# Patient Record
Sex: Female | Born: 1995 | Race: White | Hispanic: No | Marital: Single | Smoking: Never smoker
Health system: Southern US, Community
[De-identification: ages and names within clinical notes are randomized; demographics above are authoritative.]

## PROBLEM LIST (undated history)

## (undated) DIAGNOSIS — L732 Hidradenitis suppurativa: Secondary | ICD-10-CM

## (undated) DIAGNOSIS — T7840XA Allergy, unspecified, initial encounter: Secondary | ICD-10-CM

## (undated) DIAGNOSIS — E230 Hypopituitarism: Secondary | ICD-10-CM

## (undated) DIAGNOSIS — F909 Attention-deficit hyperactivity disorder, unspecified type: Secondary | ICD-10-CM

## (undated) DIAGNOSIS — R4189 Other symptoms and signs involving cognitive functions and awareness: Secondary | ICD-10-CM

## (undated) HISTORY — DX: Hypopituitarism: E23.0

## (undated) HISTORY — DX: Allergy, unspecified, initial encounter: T78.40XA

## (undated) HISTORY — DX: Other symptoms and signs involving cognitive functions and awareness: R41.89

## (undated) HISTORY — DX: Attention-deficit hyperactivity disorder, unspecified type: F90.9

## (undated) HISTORY — DX: Hidradenitis suppurativa: L73.2

## (undated) HISTORY — PX: DENTAL SURGERY: SHX609

---
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
## 2014-01-10 ENCOUNTER — Encounter: Payer: Self-pay | Admitting: Internal Medicine

## 2014-01-10 ENCOUNTER — Ambulatory Visit (INDEPENDENT_AMBULATORY_CARE_PROVIDER_SITE_OTHER): Admitting: Internal Medicine

## 2014-01-10 VITALS — BP 106/62 | HR 87 | Temp 98.5°F | Resp 14 | Ht 62.5 in | Wt 193.8 lb

## 2014-01-10 DIAGNOSIS — F900 Attention-deficit hyperactivity disorder, predominantly inattentive type: Secondary | ICD-10-CM

## 2014-01-10 DIAGNOSIS — Z0000 Encounter for general adult medical examination without abnormal findings: Secondary | ICD-10-CM

## 2014-01-10 NOTE — Progress Notes (Signed)
Subjective:    Patient ID: Carly DellShelly D Mccadden, female    DOB: 18-Mar-1996, 18 y.o.   MRN: [id number]  HPI Here for physical Plans to apply for job but hasn't  Still helps out at home  Limited friends Doesn't drive No men in her life  Still finds the adderall helps Has mood issues when not on the med Has trouble getting thoughts out and speaking right when not taking the meds  Current Outpatient Prescriptions on File Prior to Visit  Medication Sig Dispense Refill  . amphetamine-dextroamphetamine (ADDERALL XR) 20 MG 24 hr capsule Take 1 capsule (20 mg total) by mouth every morning.  30 capsule  0  . clindamycin-benzoyl peroxide (BENZACLIN) gel Apply 1 application topically every morning.      . clindamycin-tretinoin (ZIANA) gel Apply 1 application topically at bedtime.      Marland Kitchen. ibuprofen (ADVIL,MOTRIN) 100 MG/5ML suspension Take 400 mg by mouth 3 (three) times daily as needed. For menstrual pain      . Norgestimate-Ethinyl Estradiol Triphasic (TRI-SPRINTEC) 0.18/0.215/0.25 MG-35 MCG tablet Take 1 tablet by mouth daily.       No current facility-administered medications on file prior to visit.    No Known Allergies  Past Medical History  Diagnosis Date  . Allergy   . ADHD (attention deficit hyperactivity disorder)   . Cognitive impairment   . Hypopituitarism     No past surgical history on file.  Family History  Problem Relation Age of Onset  . Multiple sclerosis Mother   . Diabetes Other   . Heart disease Other   . Hypertension Other   . Cancer Other     History   Social History  . Marital Status: Single    Spouse Name: N/A    Number of Children: N/A  . Years of Education: N/A   Occupational History  . Not on file.   Social History Main Topics  . Smoking status: Never Smoker   . Smokeless tobacco: Never Used  . Alcohol Use: No  . Drug Use: No  . Sexual Activity: Not on file   Other Topics Concern  . Not on file   Social History Narrative   Adopted by  maternal grandparents   Helping care for them now   Review of Systems  Constitutional: Negative for fatigue and unexpected weight change.       Still not really exercising-- mom trying to get her to the [employer] Wears seat belt  HENT: Positive for tinnitus. Negative for dental problem and hearing loss.        Regular with dentist   Eyes: Negative for visual disturbance.       No diplopia or unilateral vision loss  Respiratory: Negative for cough, chest tightness and shortness of breath.   Cardiovascular: Negative for chest pain, palpitations and leg swelling.  Gastrointestinal: Negative for nausea, vomiting, abdominal pain, constipation and blood in stool.       No heartburn   Endocrine: Negative for polydipsia and polyuria.  Genitourinary: Negative for dysuria, hematuria and difficulty urinating.       Periods are mostly painless on the OCP Discussed safe sex  Musculoskeletal: Positive for back pain. Negative for arthralgias and joint swelling.       Rare low back pain  Skin: Negative for rash.       Some acne still-not always consistent with the cream  Allergic/Immunologic: Positive for environmental allergies. Negative for immunocompromised state.       Rarely uses zyrtec  Neurological: Positive for headaches. Negative for dizziness, syncope, weakness, light-headedness and numbness.       Rare headaches-- tylenol helps  Psychiatric/Behavioral: Negative for sleep disturbance and dysphoric mood. The patient is not nervous/anxious.        Objective:   Physical Exam  Constitutional: She is oriented to person, place, and time. She appears well-developed and well-nourished. No distress.  HENT:  Head: Normocephalic and atraumatic.  Right Ear: External ear normal.  Left Ear: External ear normal.  Mouth/Throat: Oropharynx is clear and moist. No oropharyngeal exudate.  Eyes: Conjunctivae and EOM are normal. Pupils are equal, round, and reactive to light.  Neck: Normal range of motion.  Neck supple. No thyromegaly present.  Cardiovascular: Normal rate, regular rhythm, normal heart sounds and intact distal pulses.  Exam reveals no gallop.   No murmur heard. Pulmonary/Chest: Effort normal and breath sounds normal. No respiratory distress. She has no wheezes. She has no rales.  Abdominal: Soft. There is no tenderness.  Musculoskeletal: She exhibits no edema and no tenderness.  Lymphadenopathy:    She has no cervical adenopathy.  Neurological: She is alert and oriented to person, place, and time.  Skin:  Mild but cystic acne on face (mostly forehead)  Psychiatric: She has a normal mood and affect. Her behavior is normal.          Assessment & Plan:

## 2014-01-10 NOTE — Assessment & Plan Note (Signed)
Does okay with the medication 

## 2014-01-10 NOTE — Patient Instructions (Signed)
DASH Eating Plan DASH stands for "Dietary Approaches to Stop Hypertension." The DASH eating plan is a healthy eating plan that has been shown to reduce high blood pressure (hypertension). Additional health benefits may include reducing the risk of type 2 diabetes mellitus, heart disease, and stroke. The DASH eating plan may also help with weight loss. WHAT DO I NEED TO KNOW ABOUT THE DASH EATING PLAN? For the DASH eating plan, you will follow these general guidelines:  Choose foods with a percent daily value for sodium of less than 5% (as listed on the food label).  Use salt-free seasonings or herbs instead of table salt or sea salt.  Check with your health care provider or pharmacist before using salt substitutes.  Eat lower-sodium products, often labeled as "lower sodium" or "no salt added."  Eat fresh foods.  Eat more vegetables, fruits, and low-fat dairy products.  Choose whole grains. Look for the word "whole" as the first word in the ingredient list.  Choose fish and skinless chicken or turkey more often than red meat. Limit fish, poultry, and meat to 6 oz (170 g) each day.  Limit sweets, desserts, sugars, and sugary drinks.  Choose heart-healthy fats.  Limit cheese to 1 oz (28 g) per day.  Eat more home-cooked food and less restaurant, buffet, and fast food.  Limit fried foods.  Cook foods using methods other than frying.  Limit canned vegetables. If you do use them, rinse them well to decrease the sodium.  When eating at a restaurant, ask that your food be prepared with less salt, or no salt if possible. WHAT FOODS CAN I EAT? Seek help from a dietitian for individual calorie needs. Grains Whole grain or whole wheat bread. Brown rice. Whole grain or whole wheat pasta. Quinoa, bulgur, and whole grain cereals. Low-sodium cereals. Corn or whole wheat flour tortillas. Whole grain cornbread. Whole grain crackers. Low-sodium crackers. Vegetables Fresh or frozen vegetables  (raw, steamed, roasted, or grilled). Low-sodium or reduced-sodium tomato and vegetable juices. Low-sodium or reduced-sodium tomato sauce and paste. Low-sodium or reduced-sodium canned vegetables.  Fruits All fresh, canned (in natural juice), or frozen fruits. Meat and Other Protein Products Ground beef (85% or leaner), grass-fed beef, or beef trimmed of fat. Skinless chicken or turkey. Ground chicken or turkey. Pork trimmed of fat. All fish and seafood. Eggs. Dried beans, peas, or lentils. Unsalted nuts and seeds. Unsalted canned beans. Dairy Low-fat dairy products, such as skim or 1% milk, 2% or reduced-fat cheeses, low-fat ricotta or cottage cheese, or plain low-fat yogurt. Low-sodium or reduced-sodium cheeses. Fats and Oils Tub margarines without trans fats. Light or reduced-fat mayonnaise and salad dressings (reduced sodium). Avocado. Safflower, olive, or canola oils. Natural peanut or almond butter. Other Unsalted popcorn and pretzels. The items listed above may not be a complete list of recommended foods or beverages. Contact your dietitian for more options. WHAT FOODS ARE NOT RECOMMENDED? Grains White bread. White pasta. White rice. Refined cornbread. Bagels and croissants. Crackers that contain trans fat. Vegetables Creamed or fried vegetables. Vegetables in a cheese sauce. Regular canned vegetables. Regular canned tomato sauce and paste. Regular tomato and vegetable juices. Fruits Dried fruits. Canned fruit in light or heavy syrup. Fruit juice. Meat and Other Protein Products Fatty cuts of meat. Ribs, chicken wings, bacon, sausage, bologna, salami, chitterlings, fatback, hot dogs, bratwurst, and packaged luncheon meats. Salted nuts and seeds. Canned beans with salt. Dairy Whole or 2% milk, cream, half-and-half, and cream cheese. Whole-fat or sweetened yogurt. Full-fat   cheeses or blue cheese. Nondairy creamers and whipped toppings. Processed cheese, cheese spreads, or cheese  curds. Condiments Onion and garlic salt, seasoned salt, table salt, and sea salt. Canned and packaged gravies. Worcestershire sauce. Tartar sauce. Barbecue sauce. Teriyaki sauce. Soy sauce, including reduced sodium. Steak sauce. Fish sauce. Oyster sauce. Cocktail sauce. Horseradish. Ketchup and mustard. Meat flavorings and tenderizers. Bouillon cubes. Hot sauce. Tabasco sauce. Marinades. Taco seasonings. Relishes. Fats and Oils Butter, stick margarine, lard, shortening, ghee, and bacon fat. Coconut, palm kernel, or palm oils. Regular salad dressings. Other Pickles and olives. Salted popcorn and pretzels. The items listed above may not be a complete list of foods and beverages to avoid. Contact your dietitian for more information. WHERE CAN I FIND MORE INFORMATION? National Heart, Lung, and Blood Institute: www.nhlbi.nih.gov/health/health-topics/topics/dash/ Document Released: 03/03/2011 Document Revised: 07/29/2013 Document Reviewed: 01/16/2013 ExitCare Patient Information 2015 [employer]. This information is not intended to replace advice given to you by your health care provider. Make sure you discuss any questions you have with your health care provider. Exercise to Lose Weight Exercise and a healthy diet may help you lose weight. Your doctor may suggest specific exercises. EXERCISE IDEAS AND TIPS  Choose low-cost things you enjoy doing, such as walking, bicycling, or exercising to workout videos.  Take stairs instead of the elevator.  Walk during your lunch break.  Park your car further away from work or school.  Go to a gym or an exercise class.  Start with 5 to 10 minutes of exercise each day. Build up to 30 minutes of exercise 4 to 6 days a week.  Wear shoes with good support and comfortable clothes.  Stretch before and after working out.  Work out until you breathe harder and your heart beats faster.  Drink extra water when you exercise.  Do not do so much that you  hurt yourself, feel dizzy, or get very short of breath. Exercises that burn about 150 calories:  Running 1  miles in 15 minutes.  Playing volleyball for 45 to 60 minutes.  Washing and waxing a car for 45 to 60 minutes.  Playing touch football for 45 minutes.  Walking 1  miles in 35 minutes.  Pushing a stroller 1  miles in 30 minutes.  Playing basketball for 30 minutes.  Raking leaves for 30 minutes.  Bicycling 5 miles in 30 minutes.  Walking 2 miles in 30 minutes.  Dancing for 30 minutes.  Shoveling snow for 15 minutes.  Swimming laps for 20 minutes.  Walking up stairs for 15 minutes.  Bicycling 4 miles in 15 minutes.  Gardening for 30 to 45 minutes.  Jumping rope for 15 minutes.  Washing windows or floors for 45 to 60 minutes. Document Released: 04/16/2010 Document Revised: 06/06/2011 Document Reviewed: 04/16/2010 ExitCare Patient Information 2015 [employer]. This information is not intended to replace advice given to you by your health care provider. Make sure you discuss any questions you have with your health care provider.  

## 2014-01-10 NOTE — Assessment & Plan Note (Signed)
Healthy but needs to work more on fitness Cognitive issues limit her drive, etc Needs to apply for at least part time work--discussed with mom

## 2014-01-10 NOTE — Progress Notes (Signed)
Pre visit review using our clinic review tool, if applicable. No additional management support is needed unless otherwise documented below in the visit note. 

## 2014-02-18 ENCOUNTER — Other Ambulatory Visit: Payer: Self-pay

## 2014-02-18 MED ORDER — AMPHETAMINE-DEXTROAMPHET ER 20 MG PO CP24: 20.0000 mg | ORAL_CAPSULE | ORAL | Status: DC

## 2014-02-18 NOTE — Telephone Encounter (Addendum)
Pt's mother left v/m requesting rx adderall. Call when ready for pick up.

## 2014-02-18 NOTE — Telephone Encounter (Signed)
Mom advised.  Rx left at front desk for pick up.  

## 2014-06-02 ENCOUNTER — Other Ambulatory Visit: Payer: Self-pay

## 2014-06-02 MED ORDER — AMPHETAMINE-DEXTROAMPHET ER 20 MG PO CP24: 20.0000 mg | ORAL_CAPSULE | ORAL | Status: DC

## 2014-06-02 NOTE — Telephone Encounter (Signed)
Pt's mother left v/m requesting rx for Adderall. Call when ready for pick up. Pt last seen 01/10/14 and Adderall last filled 02/18/14.Please advise.

## 2014-06-02 NOTE — Telephone Encounter (Signed)
Carly Mclaughlin (mom) notified prescription is ready to be picked up at the front desk.

## 2014-08-02 ENCOUNTER — Emergency Department
Admission: EM | Admit: 2014-08-02 | Discharge: 2014-08-02 | Disposition: A | Discharge status: Home / Self Care | Attending: Emergency Medicine | Admitting: Emergency Medicine

## 2014-08-02 ENCOUNTER — Emergency Department

## 2014-08-02 ENCOUNTER — Encounter: Payer: Self-pay | Admitting: Emergency Medicine

## 2014-08-02 DIAGNOSIS — M79675 Pain in left toe(s): Secondary | ICD-10-CM | POA: Diagnosis present

## 2014-08-02 DIAGNOSIS — L600 Ingrowing nail: Secondary | ICD-10-CM | POA: Diagnosis not present

## 2014-08-02 DIAGNOSIS — Z793 Long term (current) use of hormonal contraceptives: Secondary | ICD-10-CM | POA: Insufficient documentation

## 2014-08-02 DIAGNOSIS — Z792 Long term (current) use of antibiotics: Secondary | ICD-10-CM | POA: Diagnosis not present

## 2014-08-02 DIAGNOSIS — L03032 Cellulitis of left toe: Secondary | ICD-10-CM | POA: Insufficient documentation

## 2014-08-02 DIAGNOSIS — Z79899 Other long term (current) drug therapy: Secondary | ICD-10-CM | POA: Diagnosis not present

## 2014-08-02 MED ORDER — BACITRACIN-NEOMYCIN-POLYMYXIN 400-5-5000 EX OINT
TOPICAL_OINTMENT | CUTANEOUS | Status: AC
  Administered 2014-08-02: 1 via TOPICAL
  Filled 2014-08-02: qty 1

## 2014-08-02 MED ORDER — SULFAMETHOXAZOLE-TRIMETHOPRIM 800-160 MG PO TABS: 1.0000 | ORAL_TABLET | Freq: Two times a day (BID) | ORAL | Status: DC

## 2014-08-02 MED ORDER — IBUPROFEN 800 MG PO TABS: 800.0000 mg | ORAL_TABLET | Freq: Three times a day (TID) | ORAL | Status: DC | PRN

## 2014-08-02 MED ORDER — BACITRACIN-NEOMYCIN-POLYMYXIN OINTMENT TUBE
TOPICAL_OINTMENT | Freq: Once | CUTANEOUS | Status: AC
  Administered 2014-08-02: 1 via TOPICAL

## 2014-08-02 MED ORDER — LIDOCAINE HCL (PF) 1 % IJ SOLN
INTRAMUSCULAR | Status: AC
  Filled 2014-08-02: qty 5

## 2014-08-02 MED ORDER — OXYCODONE-ACETAMINOPHEN 5-325 MG PO TABS
ORAL_TABLET | ORAL | Status: AC
  Filled 2014-08-02: qty 1

## 2014-08-02 MED ORDER — LIDOCAINE HCL (PF) 1 % IJ SOLN
INTRAMUSCULAR | Status: AC
  Administered 2014-08-02: 13:00:00
  Filled 2014-08-02: qty 5

## 2014-08-02 MED ORDER — TRAMADOL HCL 50 MG PO TABS: 50.0000 mg | ORAL_TABLET | Freq: Four times a day (QID) | ORAL | Status: DC | PRN

## 2014-08-02 MED ORDER — OXYCODONE-ACETAMINOPHEN 5-325 MG PO TABS
1.0000 | ORAL_TABLET | Freq: Once | ORAL | Status: AC
  Administered 2014-08-02: 1 via ORAL

## 2014-08-02 NOTE — ED Notes (Signed)
Toe reddened

## 2014-08-02 NOTE — ED Provider Notes (Signed)
[facility] Emergency Department Provider Note    ____________________________________________  Time seen: 1057 hrs.  I have reviewed the triage vital signs and the nursing notes.   HISTORY  Chief Complaint Toe Pain       HPI Carly Mclaughlin is a 19 y.o. female complaining of an ingrown toenail on both sides left great toe. He just going about 2 weeks withan discharge from the area today. Patient rates the pain as a 5/10. Patient stated control the pain with ibuprofen and Motrin. She states she is unable to wear any nose shoes secondary to her complaint.    Past Medical History  Diagnosis Date  . Allergy   . ADHD (attention deficit hyperactivity disorder)   . Cognitive impairment   . Hypopituitarism     Patient Active Problem List   Diagnosis Date Noted  . Routine general medical examination at a health care facility 01/03/2013  . Hypopituitarism   . 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  Current Outpatient Rx  Name  Route  Sig  Dispense  Refill  . amphetamine-dextroamphetamine (ADDERALL XR) 20 MG 24 hr capsule   Oral   Take 1 capsule (20 mg total) by mouth every morning.   30 capsule   0   . clindamycin-benzoyl peroxide (BENZACLIN) gel   Topical   Apply 1 application topically every morning.         . clindamycin-tretinoin (ZIANA) gel   Topical   Apply 1 application topically at bedtime.         Marland Kitchen. ibuprofen (ADVIL,MOTRIN) 100 MG/5ML suspension   Oral   Take 400 mg by mouth 3 (three) times daily as needed. For menstrual pain         . ibuprofen (ADVIL,MOTRIN) 800 MG tablet   Oral   Take 1 tablet (800 mg total) by mouth every 8 (eight) hours as needed for moderate pain.   15 tablet   0   . Norgestimate-Ethinyl Estradiol Triphasic (TRI-SPRINTEC) 0.18/0.215/0.25 MG-35 MCG tablet   Oral   Take 1 tablet by mouth daily.         Marland Kitchen.  sulfamethoxazole-trimethoprim (BACTRIM DS,SEPTRA DS) 800-160 MG per tablet   Oral   Take 1 tablet by mouth 2 (two) times daily.   20 tablet   0   . traMADol (ULTRAM) 50 MG tablet   Oral   Take 1 tablet (50 mg total) by mouth every 6 (six) hours as needed for moderate pain.   12 tablet   0     Allergies Review of patient's allergies indicates no known allergies.  Family History  Problem Relation Age of Onset  . Multiple sclerosis Mother   . Diabetes Other   . Heart disease Other   . Hypertension Other   . Cancer Other     Social History History  Substance Use Topics  . Smoking status: Never Smoker   . Smokeless tobacco: Never Used  . Alcohol Use: Not on file    Review of Systems  Constitutional: Negative for fever. Patient is anxious Eyes: Negative for visual changes. ENT: Negative for sore throat. Cardiovascular: Negative for chest pain. Respiratory: Negative for shortness of breath. Gastrointestinal: Negative for abdominal pain, vomiting and diarrhea. Genitourinary: Negative for dysuria. Musculoskeletal: Negative for back pain. Skin: Edema and erythema left great toe. Neurological: Negative for headaches, focal weakness or numbness. Psychiatric:ADHD Endocrine:Hypopituitarism Hematological/Lymphatic:Negative Allergic/Immunilogical: Negative  10-point ROS otherwise negative.  ____________________________________________   PHYSICAL EXAM:  VITAL SIGNS: ED Triage Vitals  Enc Vitals Group     BP 08/02/14 0940 116/65 mmHg     Pulse Rate 08/02/14 0940 86     Resp 08/02/14 0940 18     Temp 08/02/14 0940 98.1 F (36.7 C)     Temp Source 08/02/14 0940 Oral     SpO2 08/02/14 0940 98 %     Weight 08/02/14 0940 190 lb (86.183 kg)     Height 08/02/14 0940 5\' 2"  (1.575 m)     Head Cir --      Peak Flow --      Pain Score --      Pain Loc --      Pain Edu? --      Excl. in GC? --      Constitutional: Alert and oriented. Well appearing and in no  distress. Appears anxious Eyes: Conjunctivae are normal. PERRL. Normal extraocular movements. ENT   Head: Normocephalic and atraumatic.   Nose: No congestion/rhinnorhea.   Mouth/Throat: Mucous membranes are moist.   Neck: No stridor. Hematological/Lymphatic/Immunilogical: No cervical lymphadenopathy. Cardiovascular: Normal rate, regular rhythm. Normal and symmetric distal pulses are present in all extremities. No murmurs, rubs, or gallops. Respiratory: Normal respiratory effort without tachypnea nor retractions. Breath sounds are clear and equal bilaterally. No wheezes/rales/rhonchi. Gastrointestinal: Soft and nontender. No distention. No abdominal bruits. There is no CVA tenderness. Genitourinary: Not examined Musculoskeletal: Nontender with normal range of motion in all extremities. No joint effusions.  No lower extremity tenderness nor edema. Neurologic:  Normal speech and language. No gross focal neurologic deficits are appreciated. Speech is normal. No gait instability. Skin: Skin around left toe is erythematous and edematous. Guarding with palpation. Peripheral to express Psychiatric: Mood and affect are normal. Speech and behavior are normal. Patient exhibits appropriate insight and judgment.  ____________________________________________    LABS (pertinent positives/negatives)  None  ____________________________________________   EKG  None  ____________________________________________    RADIOLOGY  None  ____________________________________________   PROCEDURES  Procedure(s) performed: ToeNail excision. Consent from Grandmother Digital block with 1% lidocaine w/o epi Surgicial prep with betadine Bilateral Toe Nail Wedge excision Clean/ Bandage Patient tolerat procedure well Carly Dellonald Marian Meneely PA-C Critical Care performed: No  ____________________________________________   INITIAL IMPRESSION / ASSESSMENT AND PLAN / ED COURSE  Pertinent labs &  imaging results that were available during my care of the patient were reviewed by me and considered in my medical decision making (see chart for details).  Infected toenail left foot  ____________________________________________   FINAL CLINICAL IMPRESSION(S) / ED DIAGNOSES  Final diagnoses:  Ingrowing toenail with infection     Joni ReiningRonald K Chou Busler, PA-C 08/02/14 1227  Jene Everyobert Kinner, MD 08/02/14 [id number]

## 2014-08-02 NOTE — Discharge Instructions (Signed)
Ingrown Toenail An ingrown toenail occurs when the sharp edge of your toenail grows into the skin. Causes of ingrown toenails include toenails clipped too far back or poorly fitting shoes. Activities involving sudden stops (basketball, tennis) causing "toe jamming" may lead to an ingrown nail. HOME CARE INSTRUCTIONS   Soak the whole foot in warm soapy water for 20 minutes, 3 times per day.  You may lift the edge of the nail away from the sore skin by wedging a small piece of cotton under the corner of the nail. Be careful not to dig (traumatize) and cause more injury to the area.  Wear shoes that fit well. While the ingrown nail is causing problems, sandals may be beneficial.  Trim your toenails regularly and carefully. Cut your toenails straight across, not in a curve. This will prevent injury to the skin at the corners of the toenail.  Keep your feet clean and dry.  Crutches may be helpful early in treatment if walking is painful.  Antibiotics, if prescribed, should be taken as directed.  Return for a wound check in 2 days or as directed.  Only take over-the-counter or prescription medicines for pain, discomfort, or fever as directed by your caregiver. SEEK IMMEDIATE MEDICAL CARE IF:   You have a fever.  You have increasing pain, redness, swelling, or heat at the wound site.  Your toe is not better in 7 days. If conservative treatment is not successful, surgical removal of a portion or all of the nail may be necessary. MAKE SURE YOU:   Understand these instructions.  Will watch your condition.  Will get help right away if you are not doing well or get worse. Document Released: 03/11/2000 Document Revised: 06/06/2011 Document Reviewed: 03/05/2008 [facility]Exit[facility] Patient Information 2015 [place], [place]. This information is not intended to replace advice given to you by your health care provider. Make sure you discuss any questions you have with your health care provider.  Ingrown  Toenail An ingrown toenail occurs when the sharp edge of your toenail grows into the skin. Causes of ingrown toenails include toenails clipped too far back or poorly fitting shoes. Activities involving sudden stops (basketball, tennis) causing "toe jamming" may lead to an ingrown nail. HOME CARE INSTRUCTIONS   Soak the whole foot in warm soapy water for 20 minutes, 3 times per day.  You may lift the edge of the nail away from the sore skin by wedging a small piece of cotton under the corner of the nail. Be careful not to dig (traumatize) and cause more injury to the area.  Wear shoes that fit well. While the ingrown nail is causing problems, sandals may be beneficial.  Trim your toenails regularly and carefully. Cut your toenails straight across, not in a curve. This will prevent injury to the skin at the corners of the toenail.  Keep your feet clean and dry.  Crutches may be helpful early in treatment if walking is painful.  Antibiotics, if prescribed, should be taken as directed.  Return for a wound check in 2 days or as directed.  Only take over-the-counter or prescription medicines for pain, discomfort, or fever as directed by your caregiver. SEEK IMMEDIATE MEDICAL CARE IF:   You have a fever.  You have increasing pain, redness, swelling, or heat at the wound site.  Your toe is not better in 7 days. If conservative treatment is not successful, surgical removal of a portion or all of the nail may be necessary. MAKE SURE YOU:  Understand these instructions.  Will watch your condition.  Will get help right away if you are not doing well or get worse. Document Released: 03/11/2000 Document Revised: 06/06/2011 Document Reviewed: 03/05/2008 [facility]Exit[facility] Patient Information 2015 [place]ExitCare, [place]. This information is not intended to replace advice given to you by your health care provider. Make sure you discuss any questions you have with your health care provider.  Ingrown Toenail An  ingrown toenail occurs when the sharp edge of your toenail grows into the skin. Causes of ingrown toenails include toenails clipped too far back or poorly fitting shoes. Activities involving sudden stops (basketball, tennis) causing "toe jamming" may lead to an ingrown nail. HOME CARE INSTRUCTIONS   Soak the whole foot in warm soapy water for 20 minutes, 3 times per day.  You may lift the edge of the nail away from the sore skin by wedging a small piece of cotton under the corner of the nail. Be careful not to dig (traumatize) and cause more injury to the area.  Wear shoes that fit well. While the ingrown nail is causing problems, sandals may be beneficial.  Trim your toenails regularly and carefully. Cut your toenails straight across, not in a curve. This will prevent injury to the skin at the corners of the toenail.  Keep your feet clean and dry.  Crutches may be helpful early in treatment if walking is painful.  Antibiotics, if prescribed, should be taken as directed.  Return for a wound check in 2 days or as directed.  Only take over-the-counter or prescription medicines for pain, discomfort, or fever as directed by your caregiver. SEEK IMMEDIATE MEDICAL CARE IF:   You have a fever.  You have increasing pain, redness, swelling, or heat at the wound site.  Your toe is not better in 7 days. If conservative treatment is not successful, surgical removal of a portion or all of the nail may be necessary. MAKE SURE YOU:   Understand these instructions.  Will watch your condition.  Will get help right away if you are not doing well or get worse. Document Released: 03/11/2000 Document Revised: 06/06/2011 Document Reviewed: 03/05/2008 [facility]Exit[facility] Patient Information 2015 [place]Exit[place], [place]. This information is not intended to replace advice given to you by your health care provider. Make sure you discuss any questions you have with your health care provider.

## 2014-08-19 ENCOUNTER — Emergency Department
Admission: EM | Admit: 2014-08-19 | Discharge: 2014-08-19 | Disposition: A | Discharge status: Home / Self Care | Attending: Student | Admitting: Student

## 2014-08-19 ENCOUNTER — Encounter: Payer: Self-pay | Admitting: Emergency Medicine

## 2014-08-19 DIAGNOSIS — Z79899 Other long term (current) drug therapy: Secondary | ICD-10-CM | POA: Insufficient documentation

## 2014-08-19 DIAGNOSIS — L600 Ingrowing nail: Secondary | ICD-10-CM | POA: Diagnosis not present

## 2014-08-19 DIAGNOSIS — M79674 Pain in right toe(s): Secondary | ICD-10-CM | POA: Diagnosis present

## 2014-08-19 DIAGNOSIS — Z793 Long term (current) use of hormonal contraceptives: Secondary | ICD-10-CM | POA: Insufficient documentation

## 2014-08-19 MED ORDER — SULFAMETHOXAZOLE-TRIMETHOPRIM 800-160 MG PO TABS: 1.0000 | ORAL_TABLET | Freq: Two times a day (BID) | ORAL | Status: DC

## 2014-08-19 NOTE — Discharge Instructions (Signed)
Ingrown Toenail An ingrown toenail occurs when the sharp edge of your toenail grows into the skin. Causes of ingrown toenails include toenails clipped too far back or poorly fitting shoes. Activities involving sudden stops (basketball, tennis) causing "toe jamming" may lead to an ingrown nail. HOME CARE INSTRUCTIONS   Soak the whole foot in warm soapy water for 20 minutes, 3 times per day.  You may lift the edge of the nail away from the sore skin by wedging a small piece of cotton under the corner of the nail. Be careful not to dig (traumatize) and cause more injury to the area.  Wear shoes that fit well. While the ingrown nail is causing problems, sandals may be beneficial.  Trim your toenails regularly and carefully. Cut your toenails straight across, not in a curve. This will prevent injury to the skin at the corners of the toenail.  Keep your feet clean and dry.  Crutches may be helpful early in treatment if walking is painful.  Antibiotics, if prescribed, should be taken as directed.  Return for a wound check in 2 days or as directed.  Only take over-the-counter or prescription medicines for pain, discomfort, or fever as directed by your caregiver. SEEK IMMEDIATE MEDICAL CARE IF:   You have a fever.  You have increasing pain, redness, swelling, or heat at the wound site.  Your toe is not better in 7 days. If conservative treatment is not successful, surgical removal of a portion or all of the nail may be necessary. MAKE SURE YOU:   Understand these instructions.  Will watch your condition.  Will get help right away if you are not doing well or get worse. Document Released: 03/11/2000 Document Revised: 06/06/2011 Document Reviewed: 03/05/2008 [facility]ExitCare Patient Information 2015 [place]ExitCare, [place]LLC. This information is not intended to replace advice given to you by your health care provider. Make sure you discuss any questions you have with your health care provider.  Keep the  toe clean, dry, and covered.  Take the antibiotic as directed.  Use a file to trim nail edges back. See Dr. Orland Jarredroxler for ongoing treatment.

## 2014-08-19 NOTE — ED Notes (Signed)
Pt alert and oriented X4, active, cooperative, pt in NAD. RR even and unlabored, color WNL.  Pt informed to return if any life threatening symptoms occur.   

## 2014-08-19 NOTE — ED Notes (Signed)
Patient ambulatory to triage with steady gait, without difficulty or distress noted; pt reports ?ingrown right great toenail; c/o pain since last week; redness noted to lateral side nail

## 2014-08-20 NOTE — ED Provider Notes (Signed)
[facility] Emergency Department Provider Note ?____________________________________________ ? Time seen: 2110 ? I have reviewed the triage vital signs and the nursing notes. ________ HISTORY ? Chief Complaint Toe Pain  HPI  Carly Mclaughlin is a 19 y.o. female who reports to the ED with complaints of pain and redness to the right great toe for the last week. She was seen here about 3 weeks prior for treatment of an ingrown toenail to the left great toe. She returns today with some local redness and tenderness to the lateral side of the toenail on the right great toe. She denies any fever, chills, sweats, or direct trauma to the toe.    Past Medical History  Diagnosis Date  . Allergy   . ADHD (attention deficit hyperactivity disorder)   . Cognitive impairment   . Hypopituitarism     Patient Active Problem List   Diagnosis Date Noted  . Routine general medical examination at a health care facility 01/03/2013  . Hypopituitarism   . ADHD (attention deficit hyperactivity disorder), inattentive type 11/15/2006  ? History reviewed. No pertinent past surgical history. ? Current Outpatient Rx  Name  Route  Sig  Dispense  Refill  . amphetamine-dextroamphetamine (ADDERALL XR) 20 MG 24 hr capsule   Oral   Take 1 capsule (20 mg total) by mouth every morning.   30 capsule   0   . clindamycin-benzoyl peroxide (BENZACLIN) gel   Topical   Apply 1 application topically every morning.         . clindamycin-tretinoin (ZIANA) gel   Topical   Apply 1 application topically at bedtime.         Marland Kitchen ibuprofen (ADVIL,MOTRIN) 100 MG/5ML suspension   Oral   Take 400 mg by mouth 3 (three) times daily as needed. For menstrual pain         . ibuprofen (ADVIL,MOTRIN) 800 MG tablet   Oral   Take 1 tablet (800 mg total) by mouth every 8 (eight) hours as needed for moderate pain.   15 tablet   0   . Norgestimate-Ethinyl Estradiol Triphasic (TRI-SPRINTEC) 0.18/0.215/0.25  MG-35 MCG tablet   Oral   Take 1 tablet by mouth daily.         Marland Kitchen sulfamethoxazole-trimethoprim (BACTRIM DS,SEPTRA DS) 800-160 MG per tablet   Oral   Take 1 tablet by mouth 2 (two) times daily.   20 tablet   0   . traMADol (ULTRAM) 50 MG tablet   Oral   Take 1 tablet (50 mg total) by mouth every 6 (six) hours as needed for moderate pain.   12 tablet   0   ? Allergies Review of patient's allergies indicates no known allergies. ? Family History  Problem Relation Age of Onset  . Multiple sclerosis Mother   . Diabetes Other   . Heart disease Other   . Hypertension Other   . Cancer Other   ? Social History History  Substance Use Topics  . Smoking status: Never Smoker   . Smokeless tobacco: Never Used  . Alcohol Use: No   Review of Systems  Constitutional: Negative for fever. HEENT: Negative for head trauma, visual changes, sore throat. Cardiovascular: Negative for chest pain. Respiratory: Negative for shortness of breath. Musculoskeletal: Negative for back pain. Positive for great toe pain. Skin: Negative for rash. Positive for swelling to the cuticle on the right great toe Neurological: Negative for headaches, focal weakness or numbness.  10-point ROS otherwise negative. ____________________________________________  PHYSICAL EXAM:  VITAL SIGNS: ED Triage Vitals  Enc Vitals Group     BP 08/19/14 1922 154/83 mmHg     Pulse Rate 08/19/14 1922 98     Resp --      Temp 08/19/14 1922 98.3 F (36.8 C)     Temp Source 08/19/14 1922 Oral     SpO2 08/19/14 1922 99 %     Weight 08/19/14 1922 190 lb (86.183 kg)     Height 08/19/14 1922 5\' 2"  (1.575 m)     Head Cir --      Peak Flow --      Pain Score 08/19/14 1923 7     Pain Loc --      Pain Edu? --      Excl. in GC? --    Constitutional: Alert and oriented. Well appearing and in no distress. HEENT:Normocephalic and atraumatic.  PERRL. Normal extraocular movements.  Mucous membranes are moist. Cardiovascular:  Normal and symmetric distal pulses are present in the right foot.   Respiratory: Normal respiratory effort without tachypnea.  Musculoskeletal: Nontender with normal range of motion in all extremities. Right great toe with local erythema and slight swelling to the lateral aspect of the nail edge. Minimal amount of pus expressed with manipulation Neurologic:  Normal speech and language. CN II-XII grossly intact. No gait instability. Skin:  Skin is warm, dry and intact. No rash noted. Psychiatric: Mood and affect are normal. Patient exhibits appropriate insight and judgment. ____________ PROCEDURES ? Procedure(s) performed: Verbal consent obtained. Skin prep using alcohol.             Lateral aspect of the nail edge is lifted using hemostats             Lateral nail edge is cut using sterile scissors              Small amount of purulent discharge is expressed                         Patient tolerated the minor procedure well  Critical Care performed: None ______________________________________________________ INITIAL IMPRESSION / ASSESSMENT AND PLAN / ED COURSE  Ingrown toenail. Patient toe dressed with sterile disposable bandage. Home care instructions given.  Patient referred to Dr. Orland Jarredroxler for ongoing care of recurrent ingrown toenails.  ____________________________________________ FINAL CLINICAL IMPRESSION(S) / ED DIAGNOSES?  Final diagnoses:  Ingrown right big toenail      Lissa HoardJenise V Bacon Dusten Ellinwood, PA-C 08/20/14 0019  Gayla DossEryka A Gayle, MD 08/20/14 [id number]

## 2014-10-09 ENCOUNTER — Telehealth: Payer: Self-pay | Admitting: Internal Medicine

## 2014-10-09 NOTE — Telephone Encounter (Signed)
Patient Name: Carly SanfilippoSHELLY Mcmonigle  DOB: 1995/09/26    Initial Comment Caller states her daughter has been dizzy. Nausea. Just doesn't feel like herself, doesn't want to eat.   Nurse Assessment  Nurse: Sherilyn CooterHenry, RN, Thurmond ButtsWade Date/Time Lamount Cohen(Eastern Time): 10/09/2014 10:30:57 AM  Confirm and document reason for call. If symptomatic, describe symptoms. ---Caller states that her daughter is asleep at this time. I asked if she would have her daughter to call us once she wakes up. She states that she will. She states she needs to order a refill for herself, Hydrocodone. I transferred her to the main number for the office for further assistance.  Has the patient traveled out of the country within the last 30 days? ---Not Applicable  Does the patient require triage? ---No     Guidelines    Guideline Title Affirmed Question Affirmed Notes       Final Disposition User   Clinical Call Sherilyn CooterHenry, RN, Thurmond ButtsWade

## 2014-10-09 NOTE — Telephone Encounter (Signed)
Patient Name: Carly Mclaughlin DOB: Apr 21, 1995 Initial Comment caller states her dtr is having dizziness, feels lightheaded and has nausea Nurse Assessment Guidelines Guideline Title Affirmed Question Affirmed Notes Final Disposition User FINAL ATTEMPT MADE - no message left Elijah Birkaldwell, RN, Stark BrayLynda Comments When reached, caller states she already spoke to nurse, not sure that nurse called the correct #. Will verify with patient coordinator. Listened to recording, number should be [phone] When nurse attempted to reach caller at corrected #, unable to get through. # is no longer in service.

## 2014-10-10 ENCOUNTER — Encounter: Payer: Self-pay | Admitting: Family Medicine

## 2014-10-10 ENCOUNTER — Ambulatory Visit (INDEPENDENT_AMBULATORY_CARE_PROVIDER_SITE_OTHER): Admitting: Family Medicine

## 2014-10-10 VITALS — BP 110/60 | HR 106 | Temp 98.2°F | Ht 62.0 in | Wt 210.0 lb

## 2014-10-10 DIAGNOSIS — R42 Dizziness and giddiness: Secondary | ICD-10-CM | POA: Diagnosis not present

## 2014-10-10 DIAGNOSIS — R110 Nausea: Secondary | ICD-10-CM | POA: Insufficient documentation

## 2014-10-10 NOTE — Patient Instructions (Addendum)
Push fluids like water.  Start home desensitization exercise.  Stop sulfa medicaiton just in case cause SE given infection improved.  Call if symptoms not improving  In 2 weeks for lab evaluation.

## 2014-10-10 NOTE — Progress Notes (Signed)
Pre visit review using our clinic review tool, if applicable. No additional management support is needed unless otherwise documented below in the visit note. 

## 2014-10-10 NOTE — Assessment & Plan Note (Signed)
Along with decrease appetitte, HA etc.Most likely due to sulfa. NO further infection of toenails. Go ahead and stop.

## 2014-10-10 NOTE — Assessment & Plan Note (Signed)
?   secondary to sulfa vs. Low BP vs BPPV.  Push fluids.  Treat with home Eply maneuver.  Consider lab eval if not improving.

## 2014-10-10 NOTE — Progress Notes (Signed)
   Subjective:    Patient ID: Carly Mclaughlin, female    DOB: [birth year], 19 y.o.   MRN: [id number]  HPI  19 year old female pt of Dr. Karle StarchLetvak's with history of  ADD, hypopituitary presents with  new onset  nausea, dizziness in last week.  Has lightheadedness, room spinning lasts few minutes.  HAs noted occ with going from sitting to standing, getting up from bed. Occuring off and on through through the day.  Occ feels hot an cold. Occ headache. Tried OTC analgesic. BP at home   80/50.Marland Kitchen. drank some water.  No rash.  No current allergy symtpoms no ear pain, no congesiton.  BP Readings from Last 3 Encounters:  10/10/14 110/60  08/19/14 154/83  08/02/14 116/65     Currently treated for ingrown toenail,  Did n't take initially as she was supposed to Mom had her restart on sulfa now for last week.  Makes her have decreased appetitie.  toenails are healed.   Review of Systems  Constitutional: Negative for fever and fatigue.  HENT: Negative for ear pain.   Eyes: Negative for pain.  Respiratory: Negative for chest tightness and shortness of breath.   Cardiovascular: Negative for chest pain, palpitations and leg swelling.  Gastrointestinal: Negative for abdominal pain.  Genitourinary: Negative for dysuria.       Objective:   Physical Exam  Constitutional: Vital signs are normal. She appears well-developed and well-nourished. She is cooperative.  Non-toxic appearance. She does not appear ill. No distress.  Overweight female in NAD  HENT:  Head: Normocephalic.  Right Ear: Hearing, tympanic membrane, external ear and ear canal normal. Tympanic membrane is not erythematous, not retracted and not bulging.  Left Ear: Hearing, tympanic membrane, external ear and ear canal normal. Tympanic membrane is not erythematous, not retracted and not bulging.  Nose: No mucosal edema or rhinorrhea. Right sinus exhibits no maxillary sinus tenderness and no frontal sinus tenderness. Left sinus exhibits no  maxillary sinus tenderness and no frontal sinus tenderness.  Mouth/Throat: Uvula is midline, oropharynx is clear and moist and mucous membranes are normal.  Eyes: Conjunctivae, EOM and lids are normal. Pupils are equal, round, and reactive to light. Lids are everted and swept, no foreign bodies found.  Neck: Trachea normal and normal range of motion. Neck supple. Carotid bruit is not present. No thyroid mass and no thyromegaly present.  Cardiovascular: Normal rate, regular rhythm, S1 normal, S2 normal, normal heart sounds, intact distal pulses and normal pulses.  Exam reveals no gallop and no friction rub.   No murmur heard. nml capillary refill, no peripheral edmea  Pulmonary/Chest: Effort normal and breath sounds normal. No tachypnea. No respiratory distress. She has no decreased breath sounds. She has no wheezes. She has no rhonchi. She has no rales.  Abdominal: Soft. Normal appearance and bowel sounds are normal. There is no tenderness.  Neurological: She is alert.  Negative modified dix hallpike  Skin: Skin is warm, dry and intact. No rash noted.  Psychiatric: Her speech is normal and behavior is normal. Judgment and thought content normal. Her mood appears not anxious. Cognition and memory are normal. She does not exhibit a depressed mood.          Assessment & Plan:

## 2014-10-15 ENCOUNTER — Ambulatory Visit (INDEPENDENT_AMBULATORY_CARE_PROVIDER_SITE_OTHER): Admitting: Internal Medicine

## 2014-10-15 ENCOUNTER — Encounter: Payer: Self-pay | Admitting: Internal Medicine

## 2014-10-15 VITALS — BP 120/80 | HR 111 | Temp 99.3°F | Wt 211.0 lb

## 2014-10-15 DIAGNOSIS — R110 Nausea: Secondary | ICD-10-CM | POA: Diagnosis not present

## 2014-10-15 DIAGNOSIS — R590 Localized enlarged lymph nodes: Secondary | ICD-10-CM | POA: Insufficient documentation

## 2014-10-15 LAB — CBC WITH DIFFERENTIAL/PLATELET
BASOS PCT: 0.3 % (ref 0.0–3.0)
Basophils Absolute: 0 10*3/uL (ref 0.0–0.1)
Eosinophils Absolute: 0 10*3/uL (ref 0.0–0.7)
Eosinophils Relative: 0.3 % (ref 0.0–5.0)
HEMATOCRIT: 36.3 % (ref 36.0–46.0)
Hemoglobin: 12 g/dL (ref 12.0–15.0)
LYMPHS PCT: 51.7 % — AB (ref 12.0–46.0)
Lymphs Abs: 5.3 10*3/uL — ABNORMAL HIGH (ref 0.7–4.0)
MCHC: 33.2 g/dL (ref 30.0–36.0)
MCV: 83.2 fl (ref 78.0–100.0)
MONOS PCT: 8 % (ref 3.0–12.0)
Monocytes Absolute: 0.8 10*3/uL (ref 0.1–1.0)
NEUTROS ABS: 4 10*3/uL (ref 1.4–7.7)
NEUTROS PCT: 39.7 % — AB (ref 43.0–77.0)
Platelets: 248 10*3/uL (ref 150.0–400.0)
RBC: 4.36 Mil/uL (ref 3.87–5.11)
RDW: 14.3 % (ref 11.5–14.6)
WBC: 10.2 10*3/uL (ref 4.5–10.5)

## 2014-10-15 LAB — T4, FREE: Free T4: 1 ng/dL (ref 0.60–1.60)

## 2014-10-15 LAB — COMPREHENSIVE METABOLIC PANEL
ALT: 53 U/L — ABNORMAL HIGH (ref 0–35)
AST: 61 U/L — ABNORMAL HIGH (ref 0–37)
Albumin: 3.7 g/dL (ref 3.5–5.2)
Alkaline Phosphatase: 139 U/L — ABNORMAL HIGH (ref 39–117)
BUN: 7 mg/dL (ref 6–23)
CHLORIDE: 96 meq/L (ref 96–112)
CO2: 30 mEq/L (ref 19–32)
Calcium: 9.6 mg/dL (ref 8.4–10.5)
Creatinine, Ser: 0.67 mg/dL (ref 0.40–1.20)
GFR: 118.84 mL/min (ref >60.00)
GLUCOSE: 81 mg/dL (ref 70–99)
POTASSIUM: 4.2 meq/L (ref 3.5–5.1)
Sodium: 134 mEq/L — ABNORMAL LOW (ref 135–145)
Total Bilirubin: 0.6 mg/dL (ref 0.2–1.2)
Total Protein: 7.4 g/dL (ref 6.0–8.3)

## 2014-10-15 LAB — SEDIMENTATION RATE: Sed Rate: 51 mm/hr — ABNORMAL HIGH (ref 0–22)

## 2014-10-15 MED ORDER — AMPHETAMINE-DEXTROAMPHET ER 20 MG PO CP24: 20.0000 mg | ORAL_CAPSULE | ORAL | Status: DC

## 2014-10-15 NOTE — Patient Instructions (Signed)
DASH Eating Plan °DASH stands for "Dietary Approaches to Stop Hypertension." The DASH eating plan is a healthy eating plan that has been shown to reduce high blood pressure (hypertension). Additional health benefits may include reducing the risk of type 2 diabetes mellitus, heart disease, and stroke. The DASH eating plan may also help with weight loss. °WHAT DO I NEED TO KNOW ABOUT THE DASH EATING PLAN? °For the DASH eating plan, you will follow these general guidelines: °· Choose foods with a percent daily value for sodium of less than 5% (as listed on the food label). °· Use salt-free seasonings or herbs instead of table salt or sea salt. °· Check with your health care provider or pharmacist before using salt substitutes. °· Eat lower-sodium products, often labeled as "lower sodium" or "no salt added." °· Eat fresh foods. °· Eat more vegetables, fruits, and low-fat dairy products. °· Choose whole grains. Look for the word "whole" as the first word in the ingredient list. °· Choose fish and skinless chicken or turkey more often than red meat. Limit fish, poultry, and meat to 6 oz (170 g) each day. °· Limit sweets, desserts, sugars, and sugary drinks. °· Choose heart-healthy fats. °· Limit cheese to 1 oz (28 g) per day. °· Eat more home-cooked food and less restaurant, buffet, and fast food. °· Limit fried foods. °· Cook foods using methods other than frying. °· Limit canned vegetables. If you do use them, rinse them well to decrease the sodium. °· When eating at a restaurant, ask that your food be prepared with less salt, or no salt if possible. °WHAT FOODS CAN I EAT? °Seek help from a dietitian for individual calorie needs. °Grains °Whole grain or whole wheat bread. Brown rice. Whole grain or whole wheat pasta. Quinoa, bulgur, and whole grain cereals. Low-sodium cereals. Corn or whole wheat flour tortillas. Whole grain cornbread. Whole grain crackers. Low-sodium crackers. °Vegetables °Fresh or frozen vegetables  (raw, steamed, roasted, or grilled). Low-sodium or reduced-sodium tomato and vegetable juices. Low-sodium or reduced-sodium tomato sauce and paste. Low-sodium or reduced-sodium canned vegetables.  °Fruits °All fresh, canned (in natural juice), or frozen fruits. °Meat and Other Protein Products °Ground beef (85% or leaner), grass-fed beef, or beef trimmed of fat. Skinless chicken or turkey. Ground chicken or turkey. Pork trimmed of fat. All fish and seafood. Eggs. Dried beans, peas, or lentils. Unsalted nuts and seeds. Unsalted canned beans. °Dairy °Low-fat dairy products, such as skim or 1% milk, 2% or reduced-fat cheeses, low-fat ricotta or cottage cheese, or plain low-fat yogurt. Low-sodium or reduced-sodium cheeses. °Fats and Oils °Tub margarines without trans fats. Light or reduced-fat mayonnaise and salad dressings (reduced sodium). Avocado. Safflower, olive, or canola oils. Natural peanut or almond butter. °Other °Unsalted popcorn and pretzels. °The items listed above may not be a complete list of recommended foods or beverages. Contact your dietitian for more options. °WHAT FOODS ARE NOT RECOMMENDED? °Grains °White bread. White pasta. White rice. Refined cornbread. Bagels and croissants. Crackers that contain trans fat. °Vegetables °Creamed or fried vegetables. Vegetables in a cheese sauce. Regular canned vegetables. Regular canned tomato sauce and paste. Regular tomato and vegetable juices. °Fruits °Dried fruits. Canned fruit in light or heavy syrup. Fruit juice. °Meat and Other Protein Products °Fatty cuts of meat. Ribs, chicken wings, bacon, sausage, bologna, salami, chitterlings, fatback, hot dogs, bratwurst, and packaged luncheon meats. Salted nuts and seeds. Canned beans with salt. °Dairy °Whole or 2% milk, cream, half-and-half, and cream cheese. Whole-fat or sweetened yogurt. Full-fat   cheeses or blue cheese. Nondairy creamers and whipped toppings. Processed cheese, cheese spreads, or cheese  curds. °Condiments °Onion and garlic salt, seasoned salt, table salt, and sea salt. Canned and packaged gravies. Worcestershire sauce. Tartar sauce. Barbecue sauce. Teriyaki sauce. Soy sauce, including reduced sodium. Steak sauce. Fish sauce. Oyster sauce. Cocktail sauce. Horseradish. Ketchup and mustard. Meat flavorings and tenderizers. Bouillon cubes. Hot sauce. Tabasco sauce. Marinades. Taco seasonings. Relishes. °Fats and Oils °Butter, stick margarine, lard, shortening, ghee, and bacon fat. Coconut, palm kernel, or palm oils. Regular salad dressings. °Other °Pickles and olives. Salted popcorn and pretzels. °The items listed above may not be a complete list of foods and beverages to avoid. Contact your dietitian for more information. °WHERE CAN I FIND MORE INFORMATION? °National Heart, Lung, and [employer] Institute: www.nhlbi.nih.gov/health/health-topics/topics/dash/ °Document Released: 03/03/2011 Document Revised: 07/29/2013 Document Reviewed: 01/16/2013 °ExitCare® Patient Information ©2015 [employer]. This information is not intended to replace advice given to you by your health care provider. Make sure you discuss any questions you have with your health care provider. ° °

## 2014-10-15 NOTE — Assessment & Plan Note (Signed)
Persists Non specific picture Could still be self limited viral illness (?adenovirus) Discussed supportive care

## 2014-10-15 NOTE — Progress Notes (Signed)
   Subjective:    Patient ID: Carly Mclaughlin, female    DOB: 02/02/1996, 19 y.o.   MRN: [id number]  HPI Here with mom--not feeling well  Got over the dizziness from last week Has several large lumps around her neck Hot and cold Appetite is off lately---gets sense that she will get nauseated  Slight sore throat this am Very little cough Constant rhinorrhea---having trouble clearing her head Headaches last week--finally better now No ear pain  Takes tylenol and drinks water---helps some  Current Outpatient Prescriptions on File Prior to Visit  Medication Sig Dispense Refill  . acetaminophen (TYLENOL) 160 MG/5ML liquid Take by mouth every 4 (four) hours as needed for fever.    Marland Kitchen. amphetamine-dextroamphetamine (ADDERALL XR) 20 MG 24 hr capsule Take 1 capsule (20 mg total) by mouth every morning. 30 capsule 0  . clindamycin-benzoyl peroxide (BENZACLIN) gel Apply 1 application topically every morning.    . clindamycin-tretinoin (ZIANA) gel Apply 1 application topically at bedtime.    Marland Kitchen. ibuprofen (ADVIL,MOTRIN) 100 MG/5ML suspension Take 400 mg by mouth 3 (three) times daily as needed. For menstrual pain    . Norgestimate-Ethinyl Estradiol Triphasic (TRI-SPRINTEC) 0.18/0.215/0.25 MG-35 MCG tablet Take 1 tablet by mouth daily.     No current facility-administered medications on file prior to visit.    No Known Allergies  Past Medical History  Diagnosis Date  . Allergy   . ADHD (attention deficit hyperactivity disorder)   . Cognitive impairment   . Hypopituitarism     No past surgical history on file.  Family History  Problem Relation Age of Onset  . Multiple sclerosis Mother   . Diabetes Other   . Heart disease Other   . Hypertension Other   . Cancer Other     History   Social History  . Marital Status: Single    Spouse Name: N/A  . Number of Children: N/A  . Years of Education: N/A   Occupational History  . Not on file.   Social History Main Topics  . Smoking  status: Never Smoker   . Smokeless tobacco: Never Used  . Alcohol Use: No  . Drug Use: No  . Sexual Activity: Not on file   Other Topics Concern  . Not on file   Social History Narrative   Adopted by maternal grandparents   Helping care for them now   Review of Systems Is off the sulfa No diarrhea  No rash Suddenly gained 20#--- mom thinks she may not be voiding enough    Objective:   Physical Exam  Constitutional: She appears well-developed and well-nourished. No distress.  HENT:  Mouth/Throat: Oropharynx is clear and moist. No oropharyngeal exudate.  TMs normal Minimal nasal swelling  Neck: Normal range of motion. Neck supple. No thyromegaly present.  2 fairly large posterior cervical nodes-- (vs cyst on right at least)  Cardiovascular: Normal rate, regular rhythm, normal heart sounds and intact distal pulses.  Exam reveals no gallop.   No murmur heard. Pulmonary/Chest: Effort normal and breath sounds normal. No respiratory distress. She has no wheezes. She has no rales.  Abdominal: Soft. She exhibits no distension and no mass. There is no tenderness. There is no rebound and no guarding.  No apparent HSM  Musculoskeletal: She exhibits no edema.  Calves slightly tight but no clear fluid  Skin: No rash noted.          Assessment & Plan:

## 2014-10-15 NOTE — Assessment & Plan Note (Signed)
No other nodes anywhere though May be related to non specific (?viral) picture now Given sudden weight gain--will check labs Discussed healthy eating though

## 2014-10-15 NOTE — Progress Notes (Signed)
Pre visit review using our clinic review tool, if applicable. No additional management support is needed unless otherwise documented below in the visit note. 

## 2014-11-05 ENCOUNTER — Encounter: Payer: Self-pay | Admitting: Internal Medicine

## 2014-11-05 ENCOUNTER — Ambulatory Visit (INDEPENDENT_AMBULATORY_CARE_PROVIDER_SITE_OTHER): Admitting: Internal Medicine

## 2014-11-05 VITALS — BP 110/78 | HR 92 | Temp 97.7°F | Wt 211.0 lb

## 2014-11-05 DIAGNOSIS — R590 Localized enlarged lymph nodes: Secondary | ICD-10-CM | POA: Diagnosis not present

## 2014-11-05 NOTE — Progress Notes (Signed)
Pre visit review using our clinic review tool, if applicable. No additional management support is needed unless otherwise documented below in the visit note. 

## 2014-11-05 NOTE — Assessment & Plan Note (Signed)
Seems more up as occipital now--but I think they are the same nodes Non tender No other worrisome findings Along with the slightly elevated LFTs at last visit, seems to indicate self limited viral illness Reassured Observation only

## 2014-11-05 NOTE — Progress Notes (Signed)
   Subjective:    Patient ID: Carly Mclaughlin, female    DOB: 01/22/1996, 19 y.o.   MRN: [id number]  HPI Here with mom The cervical knots have decreased in size and are not really painful Then yesterday seemed to have pain (?spasm) yesterday  No fever She gets hot and sweaty at night--has to turn the A/C down Occasionally briefly feels cold  No cough or SOB No sore throat No ear pain  Current Outpatient Prescriptions on File Prior to Visit  Medication Sig Dispense Refill  . acetaminophen (TYLENOL) 160 MG/5ML liquid Take by mouth every 4 (four) hours as needed for fever.    Marland Kitchen amphetamine-dextroamphetamine (ADDERALL XR) 20 MG 24 hr capsule Take 1 capsule (20 mg total) by mouth every morning. 30 capsule 0  . clindamycin-benzoyl peroxide (BENZACLIN) gel Apply 1 application topically every morning.    . clindamycin-tretinoin (ZIANA) gel Apply 1 application topically at bedtime.    Marland Kitchen ibuprofen (ADVIL,MOTRIN) 100 MG/5ML suspension Take 400 mg by mouth 3 (three) times daily as needed. For menstrual pain    . Norgestimate-Ethinyl Estradiol Triphasic (TRI-SPRINTEC) 0.18/0.215/0.25 MG-35 MCG tablet Take 1 tablet by mouth daily.     No current facility-administered medications on file prior to visit.    No Known Allergies  Past Medical History  Diagnosis Date  . Allergy   . ADHD (attention deficit hyperactivity disorder)   . Cognitive impairment   . Hypopituitarism     No past surgical history on file.  Family History  Problem Relation Age of Onset  . Multiple sclerosis Mother   . Diabetes Other   . Heart disease Other   . Hypertension Other   . Cancer Other     Social History   Social History  . Marital Status: Single    Spouse Name: N/A  . Number of Children: N/A  . Years of Education: N/A   Occupational History  . Not on file.   Social History Main Topics  . Smoking status: Never Smoker   . Smokeless tobacco: Never Used  . Alcohol Use: No  . Drug Use: No  .  Sexual Activity: Not on file   Other Topics Concern  . Not on file   Social History Narrative   Adopted by maternal grandparents   Helping care for them now   Review of Systems Weight is stable Appetite is good    Objective:   Physical Exam  Constitutional: She appears well-developed and well-nourished. No distress.  HENT:  Mouth/Throat: Oropharynx is clear and moist. No oropharyngeal exudate.  TMs fine  Neck:  Full ROM in all spheres  Abdominal:  No HSM  Lymphadenopathy:       Head (right side): Occipital adenopathy present. No submental, no submandibular, no tonsillar, no preauricular and no posterior auricular adenopathy present.       Head (left side): Occipital adenopathy present. No submental, no submandibular, no tonsillar, no preauricular and no posterior auricular adenopathy present.       Right cervical: No superficial cervical and no deep cervical adenopathy present.      Left cervical: No superficial cervical and no deep cervical adenopathy present.    She has no axillary adenopathy.       Right: No inguinal, no supraclavicular and no epitrochlear adenopathy present.       Left: No inguinal, no supraclavicular and no epitrochlear adenopathy present.          Assessment & Plan:

## 2014-12-18 ENCOUNTER — Encounter: Payer: Self-pay | Admitting: *Deleted

## 2014-12-18 ENCOUNTER — Emergency Department
Admission: EM | Admit: 2014-12-18 | Discharge: 2014-12-18 | Disposition: A | Discharge status: Home / Self Care | Attending: Emergency Medicine | Admitting: Emergency Medicine

## 2014-12-18 DIAGNOSIS — Z793 Long term (current) use of hormonal contraceptives: Secondary | ICD-10-CM | POA: Diagnosis not present

## 2014-12-18 DIAGNOSIS — L03032 Cellulitis of left toe: Secondary | ICD-10-CM | POA: Insufficient documentation

## 2014-12-18 DIAGNOSIS — IMO0002 Reserved for concepts with insufficient information to code with codable children: Secondary | ICD-10-CM

## 2014-12-18 DIAGNOSIS — Z792 Long term (current) use of antibiotics: Secondary | ICD-10-CM | POA: Insufficient documentation

## 2014-12-18 DIAGNOSIS — M79675 Pain in left toe(s): Secondary | ICD-10-CM | POA: Diagnosis present

## 2014-12-18 DIAGNOSIS — Z79899 Other long term (current) drug therapy: Secondary | ICD-10-CM | POA: Insufficient documentation

## 2014-12-18 MED ORDER — CEPHALEXIN 500 MG PO CAPS: 500.0000 mg | ORAL_CAPSULE | Freq: Three times a day (TID) | ORAL | Status: AC

## 2014-12-18 MED ORDER — CEPHALEXIN 500 MG PO CAPS
500.0000 mg | ORAL_CAPSULE | Freq: Once | ORAL | Status: AC
  Administered 2014-12-18: 500 mg via ORAL
  Filled 2014-12-18: qty 1

## 2014-12-18 NOTE — ED Provider Notes (Signed)
[facility] Emergency Department Provider Note  ____________________________________________  Time seen: Approximately 3:04 PM  I have reviewed the triage vital signs and the nursing notes.   HISTORY  Chief Complaint Toe Pain    HPI Carly Mclaughlin is a 19 y.o. female with history of paronychia, seen in May and treated with excision, antibiotics. Has noticed a return of redness, soreness to the left great toenail. She did not follow-up with a podiatrist. She has also noticed chipping of the nails bilaterally. She denies fevers chills. Otherwise is doing well.   Past Medical History  Diagnosis Date  . Allergy   . ADHD (attention deficit hyperactivity disorder)   . Cognitive impairment   . Hypopituitarism     Patient Active Problem List   Diagnosis Date Noted  . Cervical lymphadenopathy 10/15/2014  . Nausea without vomiting 10/10/2014  . Routine general medical examination at a health care facility 01/03/2013  . Hypopituitarism   . ADHD (attention deficit hyperactivity disorder), inattentive type 11/15/2006    Past Surgical History  Procedure Laterality Date  . Dental surgery Left     Current Outpatient Rx  Name  Route  Sig  Dispense  Refill  . acetaminophen (TYLENOL) 160 MG/5ML liquid   Oral   Take by mouth every 4 (four) hours as needed for fever.         Marland Kitchen amphetamine-dextroamphetamine (ADDERALL XR) 20 MG 24 hr capsule   Oral   Take 1 capsule (20 mg total) by mouth every morning.   30 capsule   0   . cephALEXin (KEFLEX) 500 MG capsule   Oral   Take 1 capsule (500 mg total) by mouth 3 (three) times daily.   21 capsule   0   . clindamycin-benzoyl peroxide (BENZACLIN) gel   Topical   Apply 1 application topically every morning.         . clindamycin-tretinoin (ZIANA) gel   Topical   Apply 1 application topically at bedtime.         Marland Kitchen ibuprofen (ADVIL,MOTRIN) 100 MG/5ML suspension   Oral   Take 400 mg by mouth 3 (three)  times daily as needed. For menstrual pain         . Norgestimate-Ethinyl Estradiol Triphasic (TRI-SPRINTEC) 0.18/0.215/0.25 MG-35 MCG tablet   Oral   Take 1 tablet by mouth daily.           Allergies Review of patient's allergies indicates no known allergies.  Family History  Problem Relation Age of Onset  . Multiple sclerosis Mother   . Diabetes Other   . Heart disease Other   . Hypertension Other   . Cancer Other     Social History Social History  Substance Use Topics  . Smoking status: Never Smoker   . Smokeless tobacco: Never Used  . Alcohol Use: No    Review of Systems Constitutional: No fever/chills Eyes: No visual changes.  10-point ROS otherwise negative.  ____________________________________________   PHYSICAL EXAM:  VITAL SIGNS: ED Triage Vitals  Enc Vitals Group     BP 12/18/14 1306 134/82 mmHg     Pulse Rate 12/18/14 1306 106     Resp 12/18/14 1306 18     Temp 12/18/14 1306 98 F (36.7 C)     Temp Source 12/18/14 1306 Oral     SpO2 12/18/14 1306 98 %     Weight 12/18/14 1306 211 lb (95.709 kg)     Height 12/18/14 1306  (1.575 m)  Head Cir --      Peak Flow --      Pain Score 12/18/14 1308 3     Pain Loc --      Pain Edu? --      Excl. in GC? --     Constitutional: Alert and oriented. Well appearing and in no acute distress. Eyes: Conjunctivae are normal. PERRL. EOMI. Head: Atraumatic. Skin:  Skin is warm, dry and intact. No rash noted. Erythema to the left great nail laterally with tenderness. Cracking of the toenail, with distal chipping. Psychiatric: Mood and affect are normal. Speech and behavior are normal.  ____________________________________________   LABS (all labs ordered are listed, but only abnormal results are displayed)  Labs Reviewed - No data to  display ____________________________________________  EKG    ____________________________________________  RADIOLOGY    ____________________________________________   PROCEDURES  Procedure(s) performed: None  Critical Care performed: No  ____________________________________________   INITIAL IMPRESSION / ASSESSMENT AND PLAN / ED COURSE  Pertinent labs & imaging results that were available during my care of the patient were reviewed by me and considered in my medical decision making (see chart for details).  19 year old female with history of paronychia to the left great toenail. Excision not indicated. Encouraged warm soaks with Epsom salts, and antibiotics. She will follow-up with podiatry. Instructed in nail care. ____________________________________________   FINAL CLINICAL IMPRESSION(S) / ED DIAGNOSES  Final diagnoses:  Paronychia, unspecified laterality      Ignacia Bayley, PA-C 12/18/14 1531  Myrna Blazer, MD 12/18/14 [id number]

## 2014-12-18 NOTE — Discharge Instructions (Signed)
Paronychia Paronychia is an inflammatory reaction involving the folds of the skin surrounding the fingernail. This is commonly caused by an infection in the skin around a nail. The most common cause of paronychia is frequent wetting of the hands (as seen with bartenders, food servers, nurses or others who wet their hands). This makes the skin around the fingernail susceptible to infection by bacteria (germs) or fungus. Other predisposing factors are:  Aggressive manicuring.  Nail biting.  Thumb sucking. The most common cause is a staphylococcal (a type of germ) infection, or a fungal (Candida) infection. When caused by a germ, it usually comes on suddenly with redness, swelling, pus and is often painful. It may get under the nail and form an abscess (collection of pus), or form an abscess around the nail. If the nail itself is infected with a fungus, the treatment is usually prolonged and may require oral medicine for up to one year. Your caregiver will determine the length of time treatment is required. The paronychia caused by bacteria (germs) may largely be avoided by not pulling on hangnails or picking at cuticles. When the infection occurs at the tips of the finger it is called felon. When the cause of paronychia is from the herpes simplex virus (HSV) it is called herpetic whitlow. TREATMENT  When an abscess is present treatment is often incision and drainage. This means that the abscess must be cut open so the pus can get out. When this is done, the following home care instructions should be followed. HOME CARE INSTRUCTIONS   It is important to keep the affected fingers very dry. Rubber or plastic gloves over cotton gloves should be used whenever the hand must be placed in water.  Keep wound clean, dry and dressed as suggested by your caregiver between warm soaks or warm compresses.  Soak in warm water for fifteen to twenty minutes three to four times per day for bacterial infections. Fungal  infections are very difficult to treat, so often require treatment for long periods of time.  For bacterial (germ) infections take antibiotics (medicine which kill germs) as directed and finish the prescription, even if the problem appears to be solved before the medicine is gone.  Only take over-the-counter or prescription medicines for pain, discomfort, or fever as directed by your caregiver. SEEK IMMEDIATE MEDICAL CARE IF:  You have redness, swelling, or increasing pain in the wound.  You notice pus coming from the wound.  You have a fever.  You notice a bad smell coming from the wound or dressing. Document Released: 09/07/2000 Document Revised: 06/06/2011 Document Reviewed: 05/09/2008 [facility] Patient Information 2015 [place], [place]. This information is not intended to replace advice given to you by your health care provider. Make sure you discuss any questions you have with your health care provider.   Take antibiotics as directed and follow up with a local podiatrist for further evalution of your toenails.

## 2014-12-18 NOTE — ED Notes (Signed)
Patient states she had an ingrown toenail on left great toe removed two weeks ago. Patient states pain has returned to the left great toe and right toe is also painful.

## 2014-12-18 NOTE — ED Notes (Signed)
States was seen in may for ingrown toenails and now both nails discolored and brittle. Has not followed up with podiatry.

## 2015-01-09 ENCOUNTER — Other Ambulatory Visit

## 2015-01-16 ENCOUNTER — Encounter: Payer: Self-pay | Admitting: Internal Medicine

## 2015-01-16 ENCOUNTER — Ambulatory Visit (INDEPENDENT_AMBULATORY_CARE_PROVIDER_SITE_OTHER): Admitting: Internal Medicine

## 2015-01-16 VITALS — BP 120/80 | HR 89 | Temp 98.3°F | Ht 62.5 in | Wt 209.0 lb

## 2015-01-16 DIAGNOSIS — E230 Hypopituitarism: Secondary | ICD-10-CM | POA: Diagnosis not present

## 2015-01-16 DIAGNOSIS — Z0000 Encounter for general adult medical examination without abnormal findings: Secondary | ICD-10-CM | POA: Diagnosis not present

## 2015-01-16 DIAGNOSIS — F900 Attention-deficit hyperactivity disorder, predominantly inattentive type: Secondary | ICD-10-CM | POA: Diagnosis not present

## 2015-01-16 DIAGNOSIS — R7989 Other specified abnormal findings of blood chemistry: Secondary | ICD-10-CM | POA: Diagnosis not present

## 2015-01-16 DIAGNOSIS — L219 Seborrheic dermatitis, unspecified: Secondary | ICD-10-CM | POA: Diagnosis not present

## 2015-01-16 LAB — COMPREHENSIVE METABOLIC PANEL
ALT: 18 U/L (ref 0–35)
AST: 15 U/L (ref 0–37)
Albumin: 4.2 g/dL (ref 3.5–5.2)
Alkaline Phosphatase: 67 U/L (ref 39–117)
BUN: 14 mg/dL (ref 6–23)
CO2: 29 mEq/L (ref 19–32)
Calcium: 10.6 mg/dL — ABNORMAL HIGH (ref 8.4–10.5)
Chloride: 100 mEq/L (ref 96–112)
Creatinine, Ser: 0.75 mg/dL (ref 0.40–1.20)
GFR: 104.08 mL/min (ref >60.00)
GLUCOSE: 80 mg/dL (ref 70–99)
Potassium: 4.5 mEq/L (ref 3.5–5.1)
Sodium: 137 mEq/L (ref 135–145)
Total Bilirubin: 0.2 mg/dL (ref 0.2–1.2)
Total Protein: 8 g/dL (ref 6.0–8.3)

## 2015-01-16 LAB — LDL CHOLESTEROL, DIRECT: Direct LDL: 98 mg/dL

## 2015-01-16 LAB — LIPID PANEL
CHOL/HDL RATIO: 4
Cholesterol: 188 mg/dL (ref 0–200)
HDL: 43.9 mg/dL (ref >39.00)
Triglycerides: 488 mg/dL — ABNORMAL HIGH (ref 0.0–149.0)

## 2015-01-16 LAB — T4, FREE: Free T4: 0.97 ng/dL (ref 0.60–1.60)

## 2015-01-16 MED ORDER — KETOCONAZOLE 2 % EX SHAM: 1.0000 "application " | MEDICATED_SHAMPOO | CUTANEOUS | Status: DC

## 2015-01-16 MED ORDER — HYDROCORTISONE 2.5 % EX CREA: TOPICAL_CREAM | Freq: Three times a day (TID) | CUTANEOUS | Status: DC | PRN

## 2015-01-16 NOTE — Progress Notes (Signed)
Subjective:    Patient ID: Carly Mclaughlin, female    DOB: 10-10-1995, 19 y.o.   MRN: [id number]  HPI Here for physical With mom  Is over the acute illness from her last time her On OCP for acne and topicals from Dr Roseanne Kaufman This is helping for acne  OCP is reducing dysmenorrhea Variable bleeding but regular  Still using adderall regularly Misses doses at times--mom trying to make her more responsible for this Not looking for job---managing household and instrumental ADLs for mom who may need back surgery Still comfortable with limited social outlets  Current Outpatient Prescriptions on File Prior to Visit  Medication Sig Dispense Refill  . acetaminophen (TYLENOL) 160 MG/5ML liquid Take by mouth every 4 (four) hours as needed for fever.    Marland Kitchen amphetamine-dextroamphetamine (ADDERALL XR) 20 MG 24 hr capsule Take 1 capsule (20 mg total) by mouth every morning. 30 capsule 0  . clindamycin-benzoyl peroxide (BENZACLIN) gel Apply 1 application topically every morning.    . clindamycin-tretinoin (ZIANA) gel Apply 1 application topically at bedtime.    . Norgestimate-Ethinyl Estradiol Triphasic (TRI-SPRINTEC) 0.18/0.215/0.25 MG-35 MCG tablet Take 1 tablet by mouth daily.     No current facility-administered medications on file prior to visit.    Allergies  Allergen Reactions  . Sulfa Antibiotics Nausea Only    Past Medical History  Diagnosis Date  . Allergy   . ADHD (attention deficit hyperactivity disorder)   . Cognitive impairment   . Hypopituitarism [facility])     Past Surgical History  Procedure Laterality Date  . Dental surgery Left     Family History  Problem Relation Age of Onset  . Multiple sclerosis Mother   . Diabetes Other   . Heart disease Other   . Hypertension Other   . Cancer Other     Social History   Social History  . Marital Status: Single    Spouse Name: N/A  . Number of Children: N/A  . Years of Education: N/A   Occupational History  . Not on  file.   Social History Main Topics  . Smoking status: Never Smoker   . Smokeless tobacco: Never Used  . Alcohol Use: No  . Drug Use: No  . Sexual Activity: Not on file   Other Topics Concern  . Not on file   Social History Narrative   Adopted by maternal grandparents   Helping care for them now   Review of Systems  Constitutional: Positive for unexpected weight change. Negative for fatigue.       Weight up 15# since last year Limited walking--discussed increasing (not just with dog)  HENT: Negative for dental problem, hearing loss and tinnitus.        Keeps up with dentist  Eyes: Negative for visual disturbance.       No diplopia or unilateral vision loss  Respiratory: Negative for cough, chest tightness and shortness of breath.   Cardiovascular: Negative for chest pain and palpitations.       Slight ankle swelling at times  Gastrointestinal: Negative for nausea, vomiting, abdominal pain, constipation and blood in stool.  Endocrine: Negative for polydipsia and polyuria.  Genitourinary: Negative for dysuria, hematuria and difficulty urinating.  Musculoskeletal: Positive for back pain. Negative for joint swelling and arthralgias.       Occ low back pain--no meds needed  Skin: Negative for rash.  Allergic/Immunologic: Positive for environmental allergies. Negative for immunocompromised state.       No problems recently  Neurological: Negative for dizziness, syncope, weakness and light-headedness.       Occasional headaches-- tylenol helps  Hematological: Negative for adenopathy. Does not bruise/bleed easily.  Psychiatric/Behavioral: Negative for sleep disturbance and dysphoric mood. The patient is not nervous/anxious.        Objective:   Physical Exam  Constitutional: She is oriented to person, place, and time. She appears well-developed and well-nourished. No distress.  HENT:  Head: Normocephalic and atraumatic.  Right Ear: External ear normal.  Left Ear: External ear  normal.  Mouth/Throat: Oropharynx is clear and moist. No oropharyngeal exudate.  Eyes: Conjunctivae are normal. Pupils are equal, round, and reactive to light.  Neck: Normal range of motion. Neck supple. No thyromegaly present.  Cardiovascular: Normal rate, regular rhythm, normal heart sounds and intact distal pulses.  Exam reveals no gallop.   No murmur heard. Pulmonary/Chest: Effort normal and breath sounds normal. No respiratory distress. She has no wheezes. She has no rales.  Abdominal: Soft. There is no tenderness.  Musculoskeletal: She exhibits no edema or tenderness.  Lymphadenopathy:    She has no cervical adenopathy.  Neurological: She is alert and oriented to person, place, and time.  Skin:  Mild scaling and redness in ears and slightly in scalp  Psychiatric: She has a normal mood and affect. Her behavior is normal.          Assessment & Plan:

## 2015-01-16 NOTE — Assessment & Plan Note (Signed)
Does okay with the adderall Helps her with focus--otherwise trouble even with basic conversations if stressed, etc

## 2015-01-16 NOTE — Assessment & Plan Note (Signed)
This does not appear to be an ongoing issue--work up as child

## 2015-01-16 NOTE — Assessment & Plan Note (Signed)
Healthy Cognitive limitations but satisfied living with and helping mom Pap next year Prefers no flu vaccine

## 2015-01-16 NOTE — Assessment & Plan Note (Signed)
Will prescribe shampoo and cream for ears

## 2015-01-16 NOTE — Progress Notes (Signed)
Pre visit review using our clinic review tool, if applicable. No additional management support is needed unless otherwise documented below in the visit note. 

## 2015-01-19 ENCOUNTER — Encounter: Payer: Self-pay | Admitting: *Deleted

## 2015-04-24 ENCOUNTER — Other Ambulatory Visit: Payer: Self-pay

## 2015-04-24 MED ORDER — AMPHETAMINE-DEXTROAMPHET ER 20 MG PO CP24: 20.0000 mg | ORAL_CAPSULE | ORAL | Status: DC

## 2015-04-24 NOTE — Telephone Encounter (Signed)
[name] pts mom (DPR not signed) left v/m requesting rx for adderall. Call when ready for pick up. rx last printed # 30 on 10/15/14. Last annual exam 01/16/15.

## 2015-04-24 NOTE — Telephone Encounter (Signed)
Spoke with patient and advised rx ready for pick-up and it will be at the front desk.  

## 2015-06-01 ENCOUNTER — Other Ambulatory Visit: Payer: Self-pay

## 2015-06-01 MED ORDER — AMPHETAMINE-DEXTROAMPHET ER 20 MG PO CP24: 20.0000 mg | ORAL_CAPSULE | ORAL | Status: DC

## 2015-06-01 NOTE — Telephone Encounter (Signed)
Left message to call back. Rx up front to pick up

## 2015-06-01 NOTE — Telephone Encounter (Signed)
Spoke to pt

## 2015-06-01 NOTE — Telephone Encounter (Signed)
V/M left requesting rx adderall. Call when ready for pick up.last printed # 30 on 04/24/15 and last annual 01/16/15.

## 2015-07-28 ENCOUNTER — Ambulatory Visit: Admitting: Primary Care

## 2015-09-24 ENCOUNTER — Other Ambulatory Visit: Payer: Self-pay | Admitting: Primary Care

## 2015-09-24 ENCOUNTER — Encounter: Payer: Self-pay | Admitting: Primary Care

## 2015-09-24 ENCOUNTER — Ambulatory Visit (INDEPENDENT_AMBULATORY_CARE_PROVIDER_SITE_OTHER): Admitting: Primary Care

## 2015-09-24 VITALS — BP 120/78 | HR 79 | Temp 98.6°F | Ht 62.5 in | Wt 224.8 lb

## 2015-09-24 DIAGNOSIS — N898 Other specified noninflammatory disorders of vagina: Secondary | ICD-10-CM | POA: Diagnosis not present

## 2015-09-24 NOTE — Progress Notes (Signed)
Subjective:    Patient ID: Franz DellShelly D Kuna, female    DOB: [birth year], 20 y.o.   MRN: [id number]  HPI  Ms. Burgess AmorSkenes is a 20 year old female who presents today with a chief complaint of vaginal discomfort. She also reports vaginal discharge that is whitish/light green in color and vaginal itching. She also has vaginal bumps to her labia majora that have been around for numerous years, and previously diagnosed as folliculitis.   Her symptoms began about 1 week ago. Over the past several days her symptoms have progressed with vaginal discomfort and vaginal discharge. She's used OTC wipes and cream without improvement. She has never been sexually active. Denies hematuria, urinary frequency, flank pain, abdominal pain.  Review of Systems  Constitutional: Negative for fever.  Gastrointestinal: Negative for abdominal pain.  Genitourinary: Positive for vaginal discharge and pelvic pain. Negative for dysuria, urgency, frequency and flank pain.       Past Medical History  Diagnosis Date  . Allergy   . ADHD (attention deficit hyperactivity disorder)   . Cognitive impairment   . Hypopituitarism [facility](HCC)      Social History   Social History  . Marital Status: Single    Spouse Name: N/A  . Number of Children: N/A  . Years of Education: N/A   Occupational History  . Not on file.   Social History Main Topics  . Smoking status: Never Smoker   . Smokeless tobacco: Never Used  . Alcohol Use: No  . Drug Use: No  . Sexual Activity: Not on file   Other Topics Concern  . Not on file   Social History Narrative   Adopted by maternal grandparents   Helping care for them now    Past Surgical History  Procedure Laterality Date  . Dental surgery Left     Family History  Problem Relation Age of Onset  . Multiple sclerosis Mother   . Diabetes Other   . Heart disease Other   . Hypertension Other   . Cancer Other     Allergies  Allergen Reactions  . Sulfa Antibiotics Nausea Only     Current Outpatient Prescriptions on File Prior to Visit  Medication Sig Dispense Refill  . acetaminophen (TYLENOL) 160 MG/5ML liquid Take by mouth every 4 (four) hours as needed for fever.    Marland Kitchen. amphetamine-dextroamphetamine (ADDERALL XR) 20 MG 24 hr capsule Take 1 capsule (20 mg total) by mouth every morning. 30 capsule 0  . clindamycin-benzoyl peroxide (BENZACLIN) gel Apply 1 application topically every morning.    . clindamycin-tretinoin (ZIANA) gel Apply 1 application topically at bedtime.    . hydrocortisone 2.5 % cream Apply topically 3 (three) times daily as needed. 28 g 3  . ketoconazole (NIZORAL) 2 % shampoo Apply 1 application topically 2 (two) times a week. 120 mL 11  . Norgestimate-Ethinyl Estradiol Triphasic (TRI-SPRINTEC) 0.18/0.215/0.25 MG-35 MCG tablet Take 1 tablet by mouth daily.     No current facility-administered medications on file prior to visit.    BP 120/78 mmHg  Pulse 79  Temp(Src) 98.6 F (37 C) (Oral)  Ht 5' 2.5" (1.588 m)  Wt 224 lb 12.8 oz (101.969 kg)  BMI 40.44 kg/m2  SpO2 98%  LMP 08/27/2015 (Within Days)    Objective:   Physical Exam  Constitutional: She appears well-nourished.  Neck: Neck supple.  Cardiovascular: Normal rate and regular rhythm.   Pulmonary/Chest: Effort normal and breath sounds normal.  Abdominal: Soft.  Genitourinary: Cervix exhibits discharge. Cervix exhibits  no motion tenderness.  Non infectious folliculitis noted to bilateral labia majora. Whitish green discharge noted. Pelvic exam very uncomfortable for patient as this is her first.  Skin: Skin is warm and dry.          Assessment & Plan:  Vaginal discharge:  Also with vaginal discomfort and itching that has been present for the past 1 week. No improvement with OTC cream. Pelvic exam performed today with evidence of folliculitis on labia majora and foul smelling whitish green discharge from vagina. No abdominal discomfort does not appear acutely ill. Wet  prep sent off for further testing. GC chlamydia not necessary as she has never been sexually active. No urinary symptoms. Results pending.  Pelvic exam challenging due to lack of patient cooperation as she was uncomfortable.  Morrie Sheldonlark,Katherine Kendal, NP

## 2015-09-24 NOTE — Patient Instructions (Signed)
I will notify you of the results of your vaginal swab tomorrow.  It was a pleasure meeting you!

## 2015-09-24 NOTE — Progress Notes (Signed)
Pre visit review using our clinic review tool, if applicable. No additional management support is needed unless otherwise documented below in the visit note. 

## 2015-09-24 NOTE — Addendum Note (Signed)
Addended by: Alvina ChouWALSH, TERRI J on: 09/24/2015 05:04 PM   Modules accepted: Orders

## 2015-09-25 ENCOUNTER — Other Ambulatory Visit: Payer: Self-pay | Admitting: Primary Care

## 2015-09-25 DIAGNOSIS — L739 Follicular disorder, unspecified: Secondary | ICD-10-CM

## 2015-09-25 LAB — WET PREP BY MOLECULAR PROBE
Candida species: NEGATIVE
GARDNERELLA VAGINALIS: NEGATIVE
Trichomonas vaginosis: NEGATIVE

## 2015-09-25 MED ORDER — MUPIROCIN CALCIUM 2 % EX CREA: 1.0000 "application " | TOPICAL_CREAM | Freq: Two times a day (BID) | CUTANEOUS | Status: DC

## 2016-01-18 ENCOUNTER — Ambulatory Visit (INDEPENDENT_AMBULATORY_CARE_PROVIDER_SITE_OTHER): Admitting: Internal Medicine

## 2016-01-18 ENCOUNTER — Encounter: Payer: Self-pay | Admitting: Internal Medicine

## 2016-01-18 VITALS — BP 106/70 | HR 104 | Temp 98.5°F | Ht 62.75 in | Wt 224.0 lb

## 2016-01-18 DIAGNOSIS — F900 Attention-deficit hyperactivity disorder, predominantly inattentive type: Secondary | ICD-10-CM | POA: Diagnosis not present

## 2016-01-18 DIAGNOSIS — Z0000 Encounter for general adult medical examination without abnormal findings: Secondary | ICD-10-CM | POA: Diagnosis not present

## 2016-01-18 DIAGNOSIS — N898 Other specified noninflammatory disorders of vagina: Secondary | ICD-10-CM | POA: Diagnosis not present

## 2016-01-18 DIAGNOSIS — Z6841 Body Mass Index (BMI) 40.0 and over, adult: Secondary | ICD-10-CM

## 2016-01-18 LAB — COMPREHENSIVE METABOLIC PANEL
ALT: 30 U/L (ref 0–35)
AST: 25 U/L (ref 0–37)
Albumin: 4.1 g/dL (ref 3.5–5.2)
Alkaline Phosphatase: 66 U/L (ref 39–117)
BUN: 10 mg/dL (ref 6–23)
CHLORIDE: 99 meq/L (ref 96–112)
CO2: 31 mEq/L (ref 19–32)
Calcium: 9.8 mg/dL (ref 8.4–10.5)
Creatinine, Ser: 0.71 mg/dL (ref 0.40–1.20)
GFR: 109.8 mL/min (ref >60.00)
GLUCOSE: 90 mg/dL (ref 70–99)
POTASSIUM: 4.5 meq/L (ref 3.5–5.1)
SODIUM: 138 meq/L (ref 135–145)
Total Bilirubin: 0.4 mg/dL (ref 0.2–1.2)
Total Protein: 7.6 g/dL (ref 6.0–8.3)

## 2016-01-18 LAB — CBC WITH DIFFERENTIAL/PLATELET
BASOS PCT: 0.3 % (ref 0.0–3.0)
Basophils Absolute: 0 10*3/uL (ref 0.0–0.1)
EOS PCT: 1.1 % (ref 0.0–5.0)
Eosinophils Absolute: 0.1 10*3/uL (ref 0.0–0.7)
HCT: 38.4 % (ref 36.0–46.0)
Hemoglobin: 13 g/dL (ref 12.0–15.0)
LYMPHS ABS: 2.7 10*3/uL (ref 0.7–4.0)
Lymphocytes Relative: 25.5 % (ref 12.0–46.0)
MCHC: 33.8 g/dL (ref 30.0–36.0)
MCV: 80.8 fl (ref 78.0–100.0)
Monocytes Absolute: 0.5 10*3/uL (ref 0.1–1.0)
Monocytes Relative: 4.9 % (ref 3.0–12.0)
NEUTROS PCT: 68.2 % (ref 43.0–77.0)
Neutro Abs: 7.3 10*3/uL (ref 1.4–7.7)
Platelets: 404 10*3/uL — ABNORMAL HIGH (ref 150.0–400.0)
RBC: 4.75 Mil/uL (ref 3.87–5.11)
RDW: 14.1 % (ref 11.5–15.5)
WBC: 10.7 10*3/uL — ABNORMAL HIGH (ref 4.0–10.5)

## 2016-01-18 LAB — T4, FREE: Free T4: 0.84 ng/dL (ref 0.60–1.60)

## 2016-01-18 MED ORDER — KETOCONAZOLE 2 % EX SHAM
1.0000 | MEDICATED_SHAMPOO | CUTANEOUS | 11 refills | Status: DC
Start: 2016-01-18 — End: 2017-04-10

## 2016-01-18 NOTE — Assessment & Plan Note (Signed)
Healthy but really out of shape Discussed no sugared drinks, increase walking, etc Prefers no flu vaccine

## 2016-01-18 NOTE — Progress Notes (Signed)
Pre visit review using our clinic review tool, if applicable. No additional management support is needed unless otherwise documented below in the visit note. 

## 2016-01-18 NOTE — Assessment & Plan Note (Signed)
And discomfort Will set up with gyn

## 2016-01-18 NOTE — Patient Instructions (Addendum)
Please stop all sweetened drinks!!  DASH Eating Plan DASH stands for "Dietary Approaches to Stop Hypertension." The DASH eating plan is a healthy eating plan that has been shown to reduce high blood pressure (hypertension). Additional health benefits may include reducing the risk of type 2 diabetes mellitus, heart disease, and stroke. The DASH eating plan may also help with weight loss. WHAT DO I NEED TO KNOW ABOUT THE DASH EATING PLAN? For the DASH eating plan, you will follow these general guidelines:  Choose foods with a percent daily value for sodium of less than 5% (as listed on the food label).  Use salt-free seasonings or herbs instead of table salt or sea salt.  Check with your health care provider or pharmacist before using salt substitutes.  Eat lower-sodium products, often labeled as "lower sodium" or "no salt added."  Eat fresh foods.  Eat more vegetables, fruits, and low-fat dairy products.  Choose whole grains. Look for the word "whole" as the first word in the ingredient list.  Choose fish and skinless chicken or [place] more often than red meat. Limit fish, poultry, and meat to 6 oz (170 g) each day.  Limit sweets, desserts, sugars, and sugary drinks.  Choose heart-healthy fats.  Limit cheese to 1 oz (28 g) per day.  Eat more home-cooked food and less restaurant, buffet, and fast food.  Limit fried foods.  Cook foods using methods other than frying.  Limit canned vegetables. If you do use them, rinse them well to decrease the sodium.  When eating at a restaurant, ask that your food be prepared with less salt, or no salt if possible. WHAT FOODS CAN I EAT? Seek help from a dietitian for individual calorie needs. Grains Whole grain or whole wheat bread. Brown rice. Whole grain or whole wheat pasta. Quinoa, bulgur, and whole grain cereals. Low-sodium cereals. Corn or whole wheat flour tortillas. Whole grain cornbread. Whole grain crackers. Low-sodium  crackers. Vegetables Fresh or frozen vegetables (raw, steamed, roasted, or grilled). Low-sodium or reduced-sodium tomato and vegetable juices. Low-sodium or reduced-sodium tomato sauce and paste. Low-sodium or reduced-sodium canned vegetables.  Fruits All fresh, canned (in natural juice), or frozen fruits. Meat and Other Protein Products Ground beef (85% or leaner), grass-fed beef, or beef trimmed of fat. Skinless chicken or [place]key. Ground chicken or [place]key. Pork trimmed of fat. All fish and seafood. Eggs. Dried beans, peas, or lentils. Unsalted nuts and seeds. Unsalted canned beans. Dairy Low-fat dairy products, such as skim or 1% milk, 2% or reduced-fat cheeses, low-fat ricotta or cottage cheese, or plain low-fat yogurt. Low-sodium or reduced-sodium cheeses. Fats and Oils Tub margarines without trans fats. Light or reduced-fat mayonnaise and salad dressings (reduced sodium). Avocado. Safflower, olive, or canola oils. Natural peanut or almond butter. Other Unsalted popcorn and pretzels. The items listed above may not be a complete list of recommended foods or beverages. Contact your dietitian for more options. WHAT FOODS ARE NOT RECOMMENDED? Grains White bread. White pasta. White rice. Refined cornbread. Bagels and croissants. Crackers that contain trans fat. Vegetables Creamed or fried vegetables. Vegetables in a cheese sauce. Regular canned vegetables. Regular canned tomato sauce and paste. Regular tomato and vegetable juices. Fruits Dried fruits. Canned fruit in light or heavy syrup. Fruit juice. Meat and Other Protein Products Fatty cuts of meat. Ribs, chicken wings, bacon, sausage, bologna, salami, chitterlings, fatback, hot dogs, bratwurst, and packaged luncheon meats. Salted nuts and seeds. Canned beans with salt. Dairy Whole or 2% milk, cream, half-and-half, and cream  cheese. Whole-fat or sweetened yogurt. Full-fat cheeses or blue cheese. Nondairy creamers and whipped toppings.  Processed cheese, cheese spreads, or cheese curds. Condiments Onion and garlic salt, seasoned salt, table salt, and sea salt. Canned and packaged gravies. Worcestershire sauce. Tartar sauce. Barbecue sauce. Teriyaki sauce. Soy sauce, including reduced sodium. Steak sauce. Fish sauce. Oyster sauce. Cocktail sauce. Horseradish. Ketchup and mustard. Meat flavorings and tenderizers. Bouillon cubes. Hot sauce. Tabasco sauce. Marinades. Taco seasonings. Relishes. Fats and Oils Butter, stick margarine, lard, shortening, ghee, and bacon fat. Coconut, palm kernel, or palm oils. Regular salad dressings. Other Pickles and olives. Salted popcorn and pretzels. The items listed above may not be a complete list of foods and beverages to avoid. Contact your dietitian for more information. WHERE CAN I FIND MORE INFORMATION? National Heart, Lung, and Blood Institute: travelstabloid.com   This information is not intended to replace advice given to you by your health care provider. Make sure you discuss any questions you have with your health care provider.   Document Released: 03/03/2011 Document Revised: 04/04/2014 Document Reviewed: 01/16/2013 Elsevier Interactive Patient Education [employer].

## 2016-01-18 NOTE — Progress Notes (Signed)
Subjective:    Patient ID: Carly Mclaughlin, female    DOB: 1995-11-08, 20 y.o.   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 acetaminophen (TYLENOL) 160 MG/5ML liquid Take by mouth every 4 (four) hours as needed for fever.    Marland Kitchen amphetamine-dextroamphetamine (ADDERALL XR) 20 MG 24 hr capsule Take 1 capsule (20 mg total) by mouth every morning. 30 capsule 0  . clindamycin-benzoyl peroxide (BENZACLIN) gel Apply 1 application topically every morning.    . clindamycin-tretinoin (ZIANA) gel Apply 1 application topically at bedtime.    . hydrocortisone 2.5 % cream Apply topically 3 (three) times daily as needed. 28 g 3  . ketoconazole (NIZORAL) 2 % shampoo Apply 1 application topically 2 (two) times a week. 120 mL 11  . mupirocin cream (BACTROBAN) 2 % Apply 1 application topically 2 (two) times daily. 15 g 0  . Norgestimate-Ethinyl Estradiol Triphasic (TRI-SPRINTEC) 0.18/0.215/0.25 MG-35 MCG tablet Take 1 tablet by mouth daily.     No current facility-administered medications on file prior to visit.     Allergies  Allergen Reactions  . Sulfa Antibiotics Nausea Only    Past Medical History:  Diagnosis Date  . ADHD (attention deficit hyperactivity disorder)   . Allergy   . Cognitive impairment   . 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 DENTAL SURGERY Left     Family History  Problem Relation Age of Onset  . Multiple sclerosis Mother   . Diabetes Other   . Heart disease Other   . Hypertension Other   . Cancer Other     Social History   Social History  . Marital status: Single    Spouse name: N/A  . Number of children: N/A  . Years of education: N/A   Occupational History  . Not on file.   Social History Main Topics  . Smoking status: Never Smoker  . Smokeless tobacco: Never Used  . Alcohol use No  . Drug use: No  . Sexual activity: Not on file   Other Topics Concern  . Not on file   Social History Narrative   Adopted by maternal grandparents   Helping care for them now     Review of Systems  Constitutional: Positive for unexpected weight change. Negative for fatigue.       Wears seat belt  HENT: Positive for tinnitus. Negative for dental problem and hearing loss.        Keeps up with dentist  Eyes: Negative for visual disturbance.       No diplopia or unilateral vision loss  Respiratory: Negative for cough, chest tightness and shortness of breath.   Cardiovascular: Negative for chest pain, palpitations and leg swelling.  Gastrointestinal: Negative for abdominal pain, blood in stool, nausea and vomiting.  Usually moves bowels 3-4 times per week--some foods help  Endocrine: Negative for polydipsia and polyuria.  Genitourinary: Positive for genital sores and vaginal discharge.       Periods are very light--and sometimes skips  Musculoskeletal: Positive for back pain. Negative for arthralgias and joint swelling.       Occ low back pain--- tylenol or other OTC prn  Skin: Negative for wound.  Allergic/Immunologic: Positive for environmental allergies. Negative for immunocompromised state.       Zyrtec helps  Neurological: Positive for headaches. Negative for dizziness, syncope and light-headedness.  Hematological: Negative for adenopathy.  Does not bruise/bleed easily.  Psychiatric/Behavioral: Positive for decreased concentration. Negative for sleep disturbance. The patient is not nervous/anxious.        Occ down days---never more than 1 day       Objective:   Physical Exam  Constitutional: She is oriented to person, place, and time. She appears well-developed and well-nourished. No distress.  HENT:  Head: Normocephalic and atraumatic.  Right Ear: External ear normal.  Left Ear: External ear normal.  Mouth/Throat: Oropharynx is clear and moist. No oropharyngeal exudate.  Eyes: Conjunctivae are normal. Pupils are equal, round, and reactive to light.  Neck: Normal range of motion. Neck supple. No thyromegaly present.  Cardiovascular: Normal rate, regular rhythm, normal heart sounds and intact distal pulses.  Exam reveals no gallop.   No murmur heard. Pulmonary/Chest: Effort normal and breath sounds normal. No respiratory distress. She has no wheezes. She has no rales.  Abdominal: Soft. There is no tenderness.  Musculoskeletal: She exhibits no edema or tenderness.  Lymphadenopathy:    She has no cervical adenopathy.  Neurological: She is alert and oriented to person, place, and time.  Skin: No rash noted. No erythema.  Psychiatric: She has a normal mood and affect. Her behavior is normal.          Assessment & Plan:

## 2016-01-18 NOTE — Assessment & Plan Note (Signed)
Will check labs Discussed healthy eating

## 2016-01-18 NOTE — Assessment & Plan Note (Signed)
Inconsistent with med but it does seem to help some

## 2016-05-02 ENCOUNTER — Encounter: Payer: Self-pay | Admitting: Internal Medicine

## 2016-05-02 ENCOUNTER — Ambulatory Visit (INDEPENDENT_AMBULATORY_CARE_PROVIDER_SITE_OTHER): Admitting: Internal Medicine

## 2016-05-02 VITALS — BP 110/78 | HR 102 | Temp 98.4°F | Wt 226.0 lb

## 2016-05-02 DIAGNOSIS — J209 Acute bronchitis, unspecified: Secondary | ICD-10-CM | POA: Diagnosis not present

## 2016-05-02 NOTE — Assessment & Plan Note (Signed)
Likely viral Is already on doxy chronically Discussed supportive care If she worsens as the week goes on, would change her to augmentin

## 2016-05-02 NOTE — Patient Instructions (Signed)
Let me know if you are worsening as the week goes on, I would try a different antibiotic.

## 2016-05-02 NOTE — Progress Notes (Signed)
Pre visit review using our clinic review tool, if applicable. No additional management support is needed unless otherwise documented below in the visit note. 

## 2016-05-02 NOTE — Progress Notes (Signed)
   Subjective:    Patient ID: Carly DellShelly D Utley, female    DOB: [birth year], 21 y.o.   MRN: [id number]  HPI Here due to cough  With mom  Has been sick for 1 week Worsened about 5 days ago Coughing up sputum No fever Sporadic cold and hot feelings No SOB No headache--mostly in throat and chest No ear pain  Using OTC mucinex and nyquil---not really helping  Current Outpatient Prescriptions on File Prior to Visit  Medication Sig Dispense Refill  . acetaminophen (TYLENOL) 160 MG/5ML liquid Take by mouth every 4 (four) hours as needed for fever.    Marland Kitchen. amphetamine-dextroamphetamine (ADDERALL XR) 20 MG 24 hr capsule Take 1 capsule (20 mg total) by mouth every morning. 30 capsule 0  . clindamycin-benzoyl peroxide (BENZACLIN) gel Apply 1 application topically every morning.    . clindamycin-tretinoin (ZIANA) gel Apply 1 application topically at bedtime.    Marland Kitchen. ketoconazole (NIZORAL) 2 % shampoo Apply 1 application topically 2 (two) times a week. 120 mL 11  . Norgestimate-Ethinyl Estradiol Triphasic (TRI-SPRINTEC) 0.18/0.215/0.25 MG-35 MCG tablet Take 1 tablet by mouth daily.     No current facility-administered medications on file prior to visit.     Allergies  Allergen Reactions  . Sulfa Antibiotics Nausea Only    Past Medical History:  Diagnosis Date  . ADHD (attention deficit hyperactivity disorder)   . Allergy   . Cognitive impairment   . 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 DENTAL SURGERY Left     Family History  Problem Relation Age of Onset  . Multiple sclerosis Mother   . Diabetes Other   . Heart disease Other   . Hypertension Other   . Cancer Other     Social History   Social History  . Marital status: Single    Spouse name: N/A  . Number of children: N/A  . Years of education: N/A   Occupational History  . Not on file.   Social History Main Topics  . Smoking status: Never Smoker  . Smokeless tobacco: Never Used   . Alcohol use No  . Drug use: No  . Sexual activity: Not on file   Other Topics Concern  . Not on file   Social History Narrative   Adopted by maternal grandparents   Helping care for them now   Review of Systems Is on doxy regularly for hidradenitis suppurativa--this is mostly controlling it Did vomit slightly--mostly with persistent cough No diarrhea Appetite is off some    Objective:   Physical Exam  Constitutional: She appears well-nourished. No distress.  HENT:  Mouth/Throat: Oropharynx is clear and moist. No oropharyngeal exudate.  No sinus tenderness TMs normal Moderate nasal inflammation  Neck: Neck supple. No thyromegaly present.  Pulmonary/Chest: Effort normal and breath sounds normal. No respiratory distress. She has no wheezes. She has no rales.  Lymphadenopathy:    She has no cervical adenopathy.          Assessment & Plan:

## 2016-05-04 ENCOUNTER — Telehealth: Payer: Self-pay

## 2016-05-04 MED ORDER — BENZONATATE 200 MG PO CAPS: 200.0000 mg | ORAL_CAPSULE | Freq: Three times a day (TID) | ORAL | 0 refills | Status: DC | PRN

## 2016-05-04 NOTE — Telephone Encounter (Signed)
Rx sent electronically. Spoke to mom

## 2016-05-04 NOTE — Telephone Encounter (Signed)
Thomasenia SalesLena pts mom said pt is feeling slightly better than when seen on 05/02/16; no fever but OTC cough med is not helping cough and request med for [facility].

## 2016-05-04 NOTE — Telephone Encounter (Signed)
Send Rx for benzonatate 200mg  tid prn #30 x 0

## 2016-05-12 NOTE — Telephone Encounter (Signed)
[name] left v/m that Carly Mclaughlin is not any better and wanted to ask Dr Alphonsus SiasLetvak what else to do. [name] request cb.

## 2016-05-13 MED ORDER — AMOXICILLIN-POT CLAVULANATE 875-125 MG PO TABS: 1.0000 | ORAL_TABLET | Freq: Two times a day (BID) | ORAL | 0 refills | Status: AC

## 2016-05-13 NOTE — Telephone Encounter (Signed)
Spoke to pt's Mom. 

## 2016-05-13 NOTE — Addendum Note (Signed)
Addended by: Tillman AbideLETVAK, Signa Cheek I on: 05/13/2016 07:08 AM   Modules accepted: Orders

## 2016-05-13 NOTE — Telephone Encounter (Signed)
Let her know that I sent a prescription for a different antibiotic for her. She should take it on a full stomach and hold the doxy while on it. May need to see her again next week if not improving

## 2016-06-09 ENCOUNTER — Ambulatory Visit (INDEPENDENT_AMBULATORY_CARE_PROVIDER_SITE_OTHER): Admitting: Obstetrics and Gynecology

## 2016-06-09 ENCOUNTER — Encounter: Payer: Self-pay | Admitting: Obstetrics and Gynecology

## 2016-06-09 VITALS — BP 120/80 | HR 95 | Ht 62.0 in | Wt 226.0 lb

## 2016-06-09 DIAGNOSIS — N898 Other specified noninflammatory disorders of vagina: Secondary | ICD-10-CM | POA: Diagnosis not present

## 2016-06-09 NOTE — Progress Notes (Signed)
   HPI:      Carly Mclaughlin is a 21 y.o. No obstetric history on file. who LMP was Patient's last menstrual period was 06/02/2016., presents today for a problem visit.  She is not sexually active.   She complains of vaginal/perineal discomfort about 2 wks ago with wiping. Sx have improved. She had noticed painful bumps as well but they are also improved. She washes with water and wipes. No increased d/c, rectal bleeding.  She was seen 11/17 for vulvar abscesses and pt was diagnosed recently with hidradenitis suppurativa.  Review of Systems  Constitutional: Negative for fever.  Gastrointestinal: Negative for blood in stool, constipation, diarrhea, nausea and vomiting.  Genitourinary: Negative for dyspareunia, dysuria, flank pain, frequency, hematuria, urgency, vaginal bleeding, vaginal discharge and vaginal pain.  Musculoskeletal: Negative for back pain.  Skin: Negative for rash.    Past Medical History:  Diagnosis Date  . ADHD (attention deficit hyperactivity disorder)   . Allergy   . Cognitive impairment   . Hypopituitarism (HCC)     Family History  Problem Relation Age of Onset  . Multiple sclerosis Mother   . Diabetes Other   . Heart disease Other   . Hypertension Other   . Cancer Other      OBJECTIVE:   Vitals:  BP 120/80   Pulse 95   Ht 5\' 2"  (1.575 m)   Wt 226 lb (102.5 kg)   LMP 06/02/2016   BMI 41.34 kg/m   Physical Exam  Constitutional: She is oriented to person, place, and time and well-developed, well-nourished, and in no distress. Vital signs are normal.  Genitourinary: Vulva exhibits lesion and tenderness. Vulva exhibits no erythema, no exudate and no rash. Vagina exhibits lesion.  Neurological: She is oriented to person, place, and time.  Dried , clumpy vaginal secretions bilat labia minora/introitus. No lesions. Mildly tender to palpate diffusely.  Assessment/Plan: Vaginal irritation - Neg exam except pt needs to clean vag area better. Discussed  getting into the vaginal cracks gently with soap/washcloth. F/u prn. Can try OTC hydrocortisone crm  Vaginal lesion - Blocked glands perineal area. Reassurance. F/u prn.     Return if symptoms worsen or fail to improve.  Rontae Inglett B. Noemie Devivo, PA-C 06/09/2016 9:52 AM

## 2016-12-06 ENCOUNTER — Encounter: Payer: Self-pay | Admitting: Family Medicine

## 2016-12-06 ENCOUNTER — Ambulatory Visit: Admitting: Family Medicine

## 2016-12-06 ENCOUNTER — Ambulatory Visit (INDEPENDENT_AMBULATORY_CARE_PROVIDER_SITE_OTHER): Admitting: Family Medicine

## 2016-12-06 VITALS — BP 120/86 | HR 92 | Temp 98.2°F | Ht 62.75 in | Wt 223.8 lb

## 2016-12-06 DIAGNOSIS — L732 Hidradenitis suppurativa: Secondary | ICD-10-CM | POA: Diagnosis not present

## 2016-12-06 DIAGNOSIS — R300 Dysuria: Secondary | ICD-10-CM

## 2016-12-06 LAB — POC URINALSYSI DIPSTICK (AUTOMATED)
Bilirubin, UA: NEGATIVE
Glucose, UA: NEGATIVE
Ketones, UA: NEGATIVE
Nitrite, UA: NEGATIVE
Spec Grav, UA: 1.025 (ref 1.010–1.025)
UROBILINOGEN UA: 0.2 U/dL
pH, UA: 6 (ref 5.0–8.0)

## 2016-12-06 MED ORDER — CLINDAMYCIN HCL 300 MG PO CAPS: 300.0000 mg | ORAL_CAPSULE | Freq: Two times a day (BID) | ORAL | 2 refills | Status: DC

## 2016-12-06 NOTE — Patient Instructions (Addendum)
Start washing in chlorhexadine  4%, an antiseptic, once weekly  Apply topical antibiotic ointment.  Can use Desitin as barrier to irritation from urine over the site.  Try trial of clindamycin. Use probiotic while on OTC to prevent GI upset.  keep appointment with dermatology as this is an issue they need to manage.  Work on weight loss.

## 2016-12-06 NOTE — Addendum Note (Signed)
Addended by: Damita LackLORING, DONNA S on: 12/06/2016 11:34 AM   Modules accepted: Orders

## 2016-12-06 NOTE — Assessment & Plan Note (Addendum)
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 No responsive to oral tetracycline per pt.  Encouraged pt to lose weight and use antiseptic, will try broader antibiotics as recommended by [facility], but current sore area is open ulcer/ sinus tract. Cover with barrier cream for pain. Likely will need intralesional steroid or biologic to treat.Marland Kitchen. Refer back to derm. Has appt in Nov.

## 2016-12-06 NOTE — Progress Notes (Signed)
Subjective:    Patient ID: Franz DellShelly D Richert, female    DOB: 07-Jun-1995, 21 y.o.   MRN: [id number]  Urinary Tract Infection   This is a new problem. The current episode started in the past 7 days. The problem has been waxing and waning. The quality of the pain is described as burning. There has been no fever. She is not sexually active. There is no history of pyelonephritis. Associated symptoms include a discharge. Pertinent negatives include no flank pain, hematuria, nausea, urgency or vomiting. She has tried nothing for the symptoms. There is no history of catheterization, kidney stones, recurrent UTIs, a single kidney, urinary stasis or a urological procedure.     She is also having issues with hidradenitis suppuritiva in armpits and in intertriginous areas. Followed by GYN and Derm.  Treated in past with doxycycline x 3-4 months.  Did not seem to help much with outbreaks.   She has noted boil left  in groin ,near vaginal area. Present in last 3-4 months  Has opened and drained off and on, recurs..  Painful in last week..Very sensitive with urination when urine hits open sore.  Treating with topical antibiotics ointment, sitz baths.  She has seen dermatology in the past.  She is morbidly obese.  Blood pressure 120/86, pulse 92, temperature 98.2 F (36.8 C), temperature source Oral, height 5' 2.75" (1.594 m), weight 223 lb 12 oz (101.5 kg), last menstrual period 11/20/2016.   Review of Systems  Constitutional: Negative for fatigue and fever.  HENT: Negative for congestion.   Eyes: Negative for pain.  Respiratory: Negative for cough and shortness of breath.   Cardiovascular: Negative for chest pain, palpitations and leg swelling.  Gastrointestinal: Negative for abdominal pain, nausea and vomiting.  Genitourinary: Negative for dysuria, flank pain, hematuria, urgency and vaginal bleeding.  Musculoskeletal: Negative for back pain.  Neurological: Negative for syncope, light-headedness and  headaches.  Psychiatric/Behavioral: Negative for dysphoric mood.       Objective:   Physical Exam  Constitutional: Vital signs are normal. She appears well-developed and well-nourished. She is cooperative.  Non-toxic appearance. She does not appear ill. No distress.  obesity  HENT:  Head: Normocephalic.  Right Ear: Hearing, tympanic membrane, external ear and ear canal normal. Tympanic membrane is not erythematous, not retracted and not bulging.  Left Ear: Hearing, tympanic membrane, external ear and ear canal normal. Tympanic membrane is not erythematous, not retracted and not bulging.  Nose: No mucosal edema or rhinorrhea. Right sinus exhibits no maxillary sinus tenderness and no frontal sinus tenderness. Left sinus exhibits no maxillary sinus tenderness and no frontal sinus tenderness.  Mouth/Throat: Uvula is midline, oropharynx is clear and moist and mucous membranes are normal.  Eyes: Pupils are equal, round, and reactive to light. Conjunctivae, EOM and lids are normal. Lids are everted and swept, no foreign bodies found.  Neck: Trachea normal and normal range of motion. Neck supple. Carotid bruit is not present. No thyroid mass and no thyromegaly present.  Cardiovascular: Normal rate, regular rhythm, S1 normal, S2 normal, normal heart sounds, intact distal pulses and normal pulses.  Exam reveals no gallop and no friction rub.   No murmur heard. Pulmonary/Chest: Effort normal and breath sounds normal. No tachypnea. No respiratory distress. She has no decreased breath sounds. She has no wheezes. She has no rhonchi. She has no rales.  Abdominal: Soft. Normal appearance and bowel sounds are normal. There is no tenderness.  Neurological: She is alert.  Skin: Skin is warm,  dry and intact. Rash noted.  Inflammatory nodules and sinus tracts in axillae and in bilateral groin and on mons. ON left groin, large dimes size ulcer from previous tract, no underlying ulcer.  Psychiatric: Her speech is  normal and behavior is normal. Judgment and thought content normal. Her mood appears not anxious. Cognition and memory are normal. She does not exhibit a depressed mood.          Assessment & Plan:

## 2016-12-07 LAB — URINE CULTURE
MICRO NUMBER:: 81000064
SPECIMEN QUALITY:: ADEQUATE

## 2016-12-08 ENCOUNTER — Telehealth: Payer: Self-pay | Admitting: Family Medicine

## 2016-12-09 NOTE — Telephone Encounter (Signed)
a 

## 2017-01-18 ENCOUNTER — Ambulatory Visit (INDEPENDENT_AMBULATORY_CARE_PROVIDER_SITE_OTHER): Admitting: Internal Medicine

## 2017-01-18 ENCOUNTER — Encounter: Payer: Self-pay | Admitting: Internal Medicine

## 2017-01-18 DIAGNOSIS — F900 Attention-deficit hyperactivity disorder, predominantly inattentive type: Secondary | ICD-10-CM

## 2017-01-18 DIAGNOSIS — Z0000 Encounter for general adult medical examination without abnormal findings: Secondary | ICD-10-CM | POA: Diagnosis not present

## 2017-01-18 MED ORDER — AMPHETAMINE-DEXTROAMPHET ER 20 MG PO CP24: 20.0000 mg | ORAL_CAPSULE | ORAL | 0 refills | Status: DC

## 2017-01-18 NOTE — Assessment & Plan Note (Signed)
Healthy but needs to work on fitness Discussed exercise DASH plan Prefers no flu vaccine

## 2017-01-18 NOTE — Assessment & Plan Note (Signed)
Not generally using adderall Discussed that this can be saved for when she has plans, busy day, etc

## 2017-01-18 NOTE — Progress Notes (Signed)
Subjective:    Patient ID: Carly Mclaughlin, female    DOB: Mar 30, 1995, 21 y.o.   MRN: [id number]  HPI Here with mom for physical No new concerns  Did see gyn Had exam but apparently not internal Recent visit for dysuria--- antibiotic resolved the problem  Still sees Dr Roseanne KaufmanIsenstein regularly Still on acne Rx---looks good  Rough 6 months Aunt, uncle,, dog all died  Continues to stay home with mom Helps with housework, etc  Continues on the adderall--restarted it recently after being off for some time  Trying to walk more in past few weeks Discussed healthy eating  Current Outpatient Prescriptions on File Prior to Visit  Medication Sig Dispense Refill  . acetaminophen (TYLENOL) 160 MG/5ML liquid Take by mouth every 4 (four) hours as needed for fever.    Marland Kitchen. amphetamine-dextroamphetamine (ADDERALL XR) 20 MG 24 hr capsule Take 1 capsule (20 mg total) by mouth every morning. 30 capsule 0  . clindamycin (CLEOCIN) 300 MG capsule Take 1 capsule (300 mg total) by mouth 2 (two) times daily. X 10 weeks or until derm appt 60 capsule 2  . clindamycin-benzoyl peroxide (BENZACLIN) gel Apply 1 application topically every morning.    . clindamycin-tretinoin (ZIANA) gel Apply 1 application topically at bedtime.    Marland Kitchen. ketoconazole (NIZORAL) 2 % shampoo Apply 1 application topically 2 (two) times a week. 120 mL 11  . Norgestimate-Ethinyl Estradiol Triphasic (TRI-SPRINTEC) 0.18/0.215/0.25 MG-35 MCG tablet Take 1 tablet by mouth daily.    Marland Kitchen. triamcinolone ointment (KENALOG) 0.1 %      No current facility-administered medications on file prior to visit.     Allergies  Allergen Reactions  . Sulfa Antibiotics Nausea Only    Past Medical History:  Diagnosis Date  . ADHD (attention deficit hyperactivity disorder)   . Allergy   . Cognitive impairment   . Hidradenitis suppurativa   . 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 DENTAL SURGERY Left     Family  History  Problem Relation Age of Onset  . Multiple sclerosis Mother   . Diabetes Other   . Heart disease Other   . Hypertension Other   . Cancer Other     Social History   Social History  . Marital status: Single    Spouse name: N/A  . Number of children: N/A  . Years of education: N/A   Occupational History  . Not on file.   Social History Main Topics  . Smoking status: Never Smoker  . Smokeless tobacco: Never Used  . Alcohol use No  . Drug use: No  . Sexual activity: Not on file   Other Topics Concern  . Not on file   Social History Narrative   Adopted by maternal grandparents   Helping care for them now   Review of Systems  Constitutional: Negative for fatigue and unexpected weight change.       Wears seat belt  HENT: Positive for tinnitus. Negative for dental problem, hearing loss and trouble swallowing.        Keeps up with dentist  Eyes: Negative for visual disturbance.       No diplopia or unilateral vision loss  Respiratory: Negative for cough, chest tightness and shortness of breath.   Cardiovascular: Negative for chest pain, palpitations and leg swelling.  Gastrointestinal: Negative for blood in stool, constipation and nausea.       No heartburn   Endocrine: Negative for polydipsia and polyuria.  Genitourinary: Negative for difficulty urinating and frequency.       Periods regular with OCP  Musculoskeletal: Negative for arthralgias and joint swelling.       Occasional low back pain  Skin:       No new skin problems  Allergic/Immunologic: Positive for environmental allergies. Negative for immunocompromised state.       Uses cetirizine prn  Neurological: Positive for headaches. Negative for dizziness, syncope and light-headedness.  Hematological: Negative for adenopathy. Does not bruise/bleed easily.  Psychiatric/Behavioral: Positive for dysphoric mood. Negative for sleep disturbance. The patient is not nervous/anxious.        Moody at times---nothing  extended       Objective:   Physical Exam  Constitutional: She is oriented to person, place, and time. No distress.  HENT:  Head: Normocephalic and atraumatic.  Right Ear: External ear normal.  Left Ear: External ear normal.  Mouth/Throat: Oropharynx is clear and moist. No oropharyngeal exudate.  Eyes: Pupils are equal, round, and reactive to light. Conjunctivae are normal.  Neck: No thyromegaly present.  Cardiovascular: Normal rate, regular rhythm, normal heart sounds and intact distal pulses.  Exam reveals no gallop.   No murmur heard. Pulmonary/Chest: Effort normal and breath sounds normal. No respiratory distress. She has no wheezes. She has no rales.  Abdominal: Soft. She exhibits no distension. There is no tenderness. There is no rebound and no guarding.  Musculoskeletal: She exhibits no edema or tenderness.  Lymphadenopathy:    She has no cervical adenopathy.  Neurological: She is alert and oriented to person, place, and time.  Skin:  Small nodule right calf (looks like sequelae of insect bite) but not inflamed  Psychiatric: She has a normal mood and affect. Her behavior is normal.          Assessment & Plan:

## 2017-01-18 NOTE — Patient Instructions (Signed)
DASH Eating Plan DASH stands for "Dietary Approaches to Stop Hypertension." The DASH eating plan is a healthy eating plan that has been shown to reduce high blood pressure (hypertension). It may also reduce your risk for type 2 diabetes, heart disease, and stroke. The DASH eating plan may also help with weight loss. What are tips for following this plan? General guidelines  Avoid eating more than 2,300 mg (milligrams) of salt (sodium) a day. If you have hypertension, you may need to reduce your sodium intake to 1,500 mg a day.  Limit alcohol intake to no more than 1 drink a day for nonpregnant women and 2 drinks a day for men. One drink equals 12 oz of beer, 5 oz of wine, or 1 oz of hard liquor.  Work with your health care provider to maintain a healthy body weight or to lose weight. Ask what an ideal weight is for you.  Get at least 30 minutes of exercise that causes your heart to beat faster (aerobic exercise) most days of the week. Activities may include walking, swimming, or biking.  Work with your health care provider or diet and nutrition specialist (dietitian) to adjust your eating plan to your individual calorie needs. Reading food labels  Check food labels for the amount of sodium per serving. Choose foods with less than 5 percent of the Daily Value of sodium. Generally, foods with less than 300 mg of sodium per serving fit into this eating plan.  To find whole grains, look for the word "whole" as the first word in the ingredient list. Shopping  Buy products labeled as "low-sodium" or "no salt added."  Buy fresh foods. Avoid canned foods and premade or frozen meals. Cooking  Avoid adding salt when cooking. Use salt-free seasonings or herbs instead of table salt or sea salt. Check with your health care provider or pharmacist before using salt substitutes.  Do not fry foods. Cook foods using healthy methods such as baking, boiling, grilling, and broiling instead.  Cook with  heart-healthy oils, such as olive, canola, soybean, or sunflower oil. Meal planning   Eat a balanced diet that includes: ? 5 or more servings of fruits and vegetables each day. At each meal, try to fill half of your plate with fruits and vegetables. ? Up to 6-8 servings of whole grains each day. ? Less than 6 oz of lean meat, poultry, or fish each day. A 3-oz serving of meat is about the same size as a deck of cards. One egg equals 1 oz. ? 2 servings of low-fat dairy each day. ? A serving of nuts, seeds, or beans 5 times each week. ? Heart-healthy fats. Healthy fats called Omega-3 fatty acids are found in foods such as flaxseeds and coldwater fish, like sardines, salmon, and mackerel.  Limit how much you eat of the following: ? Canned or prepackaged foods. ? Food that is high in trans fat, such as fried foods. ? Food that is high in saturated fat, such as fatty meat. ? Sweets, desserts, sugary drinks, and other foods with added sugar. ? Full-fat dairy products.  Do not salt foods before eating.  Try to eat at least 2 vegetarian meals each week.  Eat more home-cooked food and less restaurant, buffet, and fast food.  When eating at a restaurant, ask that your food be prepared with less salt or no salt, if possible. What foods are recommended? The items listed may not be a complete list. Talk with your dietitian about what   dietary choices are best for you. Grains Whole-grain or whole-wheat bread. Whole-grain or whole-wheat pasta. Brown rice. Oatmeal. Quinoa. Bulgur. Whole-grain and low-sodium cereals. Pita bread. Low-fat, low-sodium crackers. Whole-wheat flour tortillas. Vegetables Fresh or frozen vegetables (raw, steamed, roasted, or grilled). Low-sodium or reduced-sodium tomato and vegetable juice. Low-sodium or reduced-sodium tomato sauce and tomato paste. Low-sodium or reduced-sodium canned vegetables. Fruits All fresh, dried, or frozen fruit. Canned fruit in natural juice (without  added sugar). Meat and other protein foods Skinless chicken or turkey. Ground chicken or turkey. Pork with fat trimmed off. Fish and seafood. Egg whites. Dried beans, peas, or lentils. Unsalted nuts, nut butters, and seeds. Unsalted canned beans. Lean cuts of beef with fat trimmed off. Low-sodium, lean deli meat. Dairy Low-fat (1%) or fat-free (skim) milk. Fat-free, low-fat, or reduced-fat cheeses. Nonfat, low-sodium ricotta or cottage cheese. Low-fat or nonfat yogurt. Low-fat, low-sodium cheese. Fats and oils Soft margarine without trans fats. Vegetable oil. Low-fat, reduced-fat, or light mayonnaise and salad dressings (reduced-sodium). Canola, safflower, olive, soybean, and sunflower oils. Avocado. Seasoning and other foods Herbs. Spices. Seasoning mixes without salt. Unsalted popcorn and pretzels. Fat-free sweets. What foods are not recommended? The items listed may not be a complete list. Talk with your dietitian about what dietary choices are best for you. Grains Baked goods made with fat, such as croissants, muffins, or some breads. Dry pasta or rice meal packs. Vegetables Creamed or fried vegetables. Vegetables in a cheese sauce. Regular canned vegetables (not low-sodium or reduced-sodium). Regular canned tomato sauce and paste (not low-sodium or reduced-sodium). Regular tomato and vegetable juice (not low-sodium or reduced-sodium). Pickles. Olives. Fruits Canned fruit in a light or heavy syrup. Fried fruit. Fruit in cream or butter sauce. Meat and other protein foods Fatty cuts of meat. Ribs. Fried meat. Bacon. Sausage. Bologna and other processed lunch meats. Salami. Fatback. Hotdogs. Bratwurst. Salted nuts and seeds. Canned beans with added salt. Canned or smoked fish. Whole eggs or egg yolks. Chicken or turkey with skin. Dairy Whole or 2% milk, cream, and half-and-half. Whole or full-fat cream cheese. Whole-fat or sweetened yogurt. Full-fat cheese. Nondairy creamers. Whipped toppings.  Processed cheese and cheese spreads. Fats and oils Butter. Stick margarine. Lard. Shortening. Ghee. Bacon fat. Tropical oils, such as coconut, palm kernel, or palm oil. Seasoning and other foods Salted popcorn and pretzels. Onion salt, garlic salt, seasoned salt, table salt, and sea salt. Worcestershire sauce. Tartar sauce. Barbecue sauce. Teriyaki sauce. Soy sauce, including reduced-sodium. Steak sauce. Canned and packaged gravies. Fish sauce. Oyster sauce. Cocktail sauce. Horseradish that you find on the shelf. Ketchup. Mustard. Meat flavorings and tenderizers. Bouillon cubes. Hot sauce and Tabasco sauce. Premade or packaged marinades. Premade or packaged taco seasonings. Relishes. Regular salad dressings. Where to find more information:  National Heart, Lung, and Blood Institute: www.nhlbi.nih.gov  American Heart Association: www.heart.org Summary  The DASH eating plan is a healthy eating plan that has been shown to reduce high blood pressure (hypertension). It may also reduce your risk for type 2 diabetes, heart disease, and stroke.  With the DASH eating plan, you should limit salt (sodium) intake to 2,300 mg a day. If you have hypertension, you may need to reduce your sodium intake to 1,500 mg a day.  When on the DASH eating plan, aim to eat more fresh fruits and vegetables, whole grains, lean proteins, low-fat dairy, and heart-healthy fats.  Work with your health care provider or diet and nutrition specialist (dietitian) to adjust your eating plan to your individual   calorie needs. This information is not intended to replace advice given to you by your health care provider. Make sure you discuss any questions you have with your health care provider. Document Released: 03/03/2011 Document Revised: 03/07/2016 Document Reviewed: 03/07/2016 Elsevier Interactive Patient Education  2017 Elsevier Inc.  

## 2017-02-20 ENCOUNTER — Telehealth: Payer: Self-pay

## 2017-02-20 NOTE — Telephone Encounter (Signed)
Pt is complaining of left ear pain for about 2 weeks. Has not tried anything for it. Here with her mother for an appt.

## 2017-02-20 NOTE — Telephone Encounter (Signed)
Okay to add on at end of my morning tomorrow---I am already overbooked today

## 2017-02-20 NOTE — Telephone Encounter (Signed)
Lyla SonCarrie, can you help me? I have lost the ability to schedule blocked spots. Can you add her on at 12pm tomorrow per Dr Alphonsus SiasLetvak? Thanks. Pt is aware.

## 2017-02-21 ENCOUNTER — Ambulatory Visit (INDEPENDENT_AMBULATORY_CARE_PROVIDER_SITE_OTHER): Admitting: Internal Medicine

## 2017-02-21 ENCOUNTER — Encounter: Payer: Self-pay | Admitting: Internal Medicine

## 2017-02-21 VITALS — BP 118/82 | HR 78 | Temp 98.1°F | Wt 228.5 lb

## 2017-02-21 DIAGNOSIS — H6123 Impacted cerumen, bilateral: Secondary | ICD-10-CM | POA: Diagnosis not present

## 2017-02-21 NOTE — Progress Notes (Signed)
   Subjective:    Patient ID: Carly DellShelly D Duty, female    DOB: 04-08-1995, 21 y.o.   MRN: [id number]  HPI Here with mom  Having problems with her hearing No tinnitus Not sick No fever, congestion, sig cough, etc  I checked her ears yesterday--had cerumen (but no time to clean)  Current Outpatient Medications on File Prior to Visit  Medication Sig Dispense Refill  . acetaminophen (TYLENOL) 160 MG/5ML liquid Take by mouth every 4 (four) hours as needed for fever.    Marland Kitchen. amphetamine-dextroamphetamine (ADDERALL XR) 20 MG 24 hr capsule Take 1 capsule (20 mg total) by mouth every morning. 30 capsule 0  . clindamycin (CLEOCIN) 300 MG capsule Take 1 capsule (300 mg total) by mouth 2 (two) times daily. X 10 weeks or until derm appt 60 capsule 2  . clindamycin-benzoyl peroxide (BENZACLIN) gel Apply 1 application topically every morning.    . clindamycin-tretinoin (ZIANA) gel Apply 1 application topically at bedtime.    Marland Kitchen. ketoconazole (NIZORAL) 2 % shampoo Apply 1 application topically 2 (two) times a week. 120 mL 11  . Norgestimate-Ethinyl Estradiol Triphasic (TRI-SPRINTEC) 0.18/0.215/0.25 MG-35 MCG tablet Take 1 tablet by mouth daily.    Marland Kitchen. triamcinolone ointment (KENALOG) 0.1 %      No current facility-administered medications on file prior to visit.     Allergies  Allergen Reactions  . Sulfa Antibiotics Nausea Only    Past Medical History:  Diagnosis Date  . ADHD (attention deficit hyperactivity disorder)   . Allergy   . Cognitive impairment   . Hidradenitis suppurativa   . 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 DENTAL SURGERY Left     Family History  Problem Relation Age of Onset  . Multiple sclerosis Mother   . Diabetes Other   . Heart disease Other   . Hypertension Other   . Cancer Other     Social History   Socioeconomic History  . Marital status: Single    Spouse name: Not on file  . Number of children: Not on file  .  Years of education: Not on file  . Highest education level: Not on file  Social Needs  . Financial resource strain: Not on file  . Food insecurity - worry: Not on file  . Food insecurity - inability: Not on file  . Transportation needs - medical: Not on file  . Transportation needs - non-medical: Not on file  Occupational History  . Not on file  Tobacco Use  . Smoking status: Never Smoker  . Smokeless tobacco: Never Used  Substance and Sexual Activity  . Alcohol use: No  . Drug use: No  . Sexual activity: Not on file  Other Topics Concern  . Not on file  Social History Narrative   Adopted by maternal grandparents   Helping care for them now   Review of Systems Appetite is good No headaches    Objective:   Physical Exam  Constitutional: No distress.  HENT:  Canals clear now No inflammation TMs normal          Assessment & Plan:

## 2017-02-21 NOTE — Assessment & Plan Note (Signed)
Hearing back to normal No signs of illness Discussed home care to prevent recurrent accumulation

## 2017-03-30 ENCOUNTER — Other Ambulatory Visit: Payer: Self-pay | Admitting: Family Medicine

## 2017-03-30 NOTE — Telephone Encounter (Signed)
Last office visit 02/21/2017.  Last refilled 12/06/2016 for #60 with 2 refills.  Ok to refill?  Dr. Alphonsus SiasLetvak out of the office and this was last refilled by you.

## 2017-03-31 NOTE — Telephone Encounter (Signed)
She was supposed to see Derm for treatment maintanance.  Denied clindamycin.. Should be refilled by Derm if the recommend continuation.

## 2017-04-10 ENCOUNTER — Other Ambulatory Visit: Payer: Self-pay | Admitting: *Deleted

## 2017-04-10 ENCOUNTER — Telehealth: Payer: Self-pay | Admitting: Internal Medicine

## 2017-04-10 MED ORDER — KETOCONAZOLE 2 % EX SHAM: 1.0000 "application " | MEDICATED_SHAMPOO | CUTANEOUS | 11 refills | Status: DC

## 2017-04-10 NOTE — Telephone Encounter (Signed)
Copied from CRM [id number]. Topic: Quick Communication - Rx Refill/Question >> Apr 10, 2017 10:25 AM Guinevere FerrariMorris, Annaliesa Blann E, NT wrote: Medication: ketoconazole (NIZORAL) 2 % shampoo   Has the patient contacted their pharmacy? Yes    (Agent: If no, request that the patient contact the pharmacy for the refill.)   Preferred Pharmacy (with phone number or street name): [facility] [place] [address] [phone] (Phone) [phone] (Fax)     Agent: Please be advised that RX refills may take up to 3 business days. We ask that you follow-up with your pharmacy.

## 2017-04-10 NOTE — Telephone Encounter (Signed)
Long term use of Rx shampoo- considered dermatologic refilled per protocol

## 2017-06-02 ENCOUNTER — Ambulatory Visit: Admitting: Family Medicine

## 2017-06-02 ENCOUNTER — Encounter: Payer: Self-pay | Admitting: Family Medicine

## 2017-06-02 ENCOUNTER — Ambulatory Visit (INDEPENDENT_AMBULATORY_CARE_PROVIDER_SITE_OTHER): Admitting: Family Medicine

## 2017-06-02 VITALS — BP 110/68 | HR 82 | Temp 99.0°F | Ht 62.5 in | Wt 229.8 lb

## 2017-06-02 DIAGNOSIS — H6123 Impacted cerumen, bilateral: Secondary | ICD-10-CM

## 2017-06-02 NOTE — Patient Instructions (Addendum)
Apply mineral oil on cotton ball and place in each ear fo 5 minutes weekly. You can also use a tissue to clean ears. Avoid Q-Tips  It has been a pleasure seeing you today. Rebecka Apley, RN, Adult-Geriatric Nurse Practitioner Student and Deboraha Sprang, FNP    Earwax Buildup, Adult The ears produce a substance called earwax that helps keep bacteria out of the ear and protects the skin in the ear canal. Occasionally, earwax can build up in the ear and cause discomfort or hearing loss. What increases the risk? This condition is more likely to develop in people who:  Are female.  Are elderly.  Naturally produce more earwax.  Clean their ears often with cotton swabs.  Use earplugs often.  Use in-ear headphones often.  Wear hearing aids.  Have narrow ear canals.  Have earwax that is overly thick or sticky.  Have eczema.  Are dehydrated.  Have excess hair in the ear canal.  What are the signs or symptoms? Symptoms of this condition include:  Reduced or muffled hearing.  A feeling of fullness in the ear or feeling that the ear is plugged.  Fluid coming from the ear.  Ear pain.  Ear itch.  Ringing in the ear.  Coughing.  An obvious piece of earwax that can be seen inside the ear canal.  How is this diagnosed? This condition may be diagnosed based on:  Your symptoms.  Your medical history.  An ear exam. During the exam, your health care provider will look into your ear with an instrument called an otoscope.  You may have tests, including a hearing test. How is this treated? This condition may be treated by:  Using ear drops to soften the earwax.  Having the earwax removed by a health care provider. The health care provider may: ? Flush the ear with water. ? Use an instrument that has a loop on the end (curette). ? Use a suction device.  Surgery to remove the wax buildup. This may be done in severe cases.  Follow these instructions at  home:  Take over-the-counter and prescription medicines only as told by your health care provider.  Do not put any objects, including cotton swabs, into your ear. You can clean the opening of your ear canal with a washcloth or facial tissue.  Follow instructions from your health care provider about cleaning your ears. Do not over-clean your ears.  Drink enough fluid to keep your urine clear or pale yellow. This will help to thin the earwax.  Keep all follow-up visits as told by your health care provider. If earwax builds up in your ears often or if you use hearing aids, consider seeing your health care provider for routine, preventive ear cleanings. Ask your health care provider how often you should schedule your cleanings.  If you have hearing aids, clean them according to instructions from the manufacturer and your health care provider. Contact a health care provider if:  You have ear pain.  You develop a fever.  You have blood, pus, or other fluid coming from your ear.  You have hearing loss.  You have ringing in your ears that does not go away.  Your symptoms do not improve with treatment.  You feel like the room is spinning (vertigo). Summary  Earwax can build up in the ear and cause discomfort or hearing loss.  The most common symptoms of this condition include reduced or muffled hearing and a feeling of fullness in the ear or  feeling that the ear is plugged.  This condition may be diagnosed based on your symptoms, your medical history, and an ear exam.  This condition may be treated by using ear drops to soften the earwax or by having the earwax removed by a health care provider.  Do not put any objects, including cotton swabs, into your ear. You can clean the opening of your ear canal with a washcloth or facial tissue. This information is not intended to replace advice given to you by your health care provider. Make sure you discuss any questions you have with your  health care provider. Document Released: 04/21/2004 Document Revised: 05/25/2016 Document Reviewed: 05/25/2016 Elsevier Interactive Patient Education  [employer]2018 Elsevier Inc.

## 2017-06-02 NOTE — Progress Notes (Signed)
Subjective:    Patient ID: Carly Mclaughlin, female    DOB: 1995-05-04, 22 y.o.   MRN: [id number]  HPI Carly Mclaughlin is a 22 y.o. female who presents today for ear cerumen impaction. Started having ear pain yesterday. She also reports flaking of ear canal. Denies ear pain or difficulty hearing. She does not use Q-Tips in the ear but does use them to clean outer ear  Review of Systems  Constitutional: Negative for fatigue and fever.  HENT: Negative for congestion and sore throat.        Past Medical History:  Diagnosis Date  . ADHD (attention deficit hyperactivity disorder)   . Allergy   . Cognitive impairment   . Hidradenitis suppurativa   . 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 DENTAL SURGERY Left    Family History  Problem Relation Age of Onset  . Multiple sclerosis Mother   . Diabetes Other   . Heart disease Other   . Hypertension Other   . Cancer Other    Social History   Socioeconomic History  . Marital status: Single    Spouse name: Not on file  . Number of children: Not on file  . Years of education: Not on file  . Highest education level: Not on file  Social Needs  . Financial resource strain: Not on file  . Food insecurity - worry: Not on file  . Food insecurity - inability: Not on file  . Transportation needs - medical: Not on file  . Transportation needs - non-medical: Not on file  Occupational History  . Not on file  Tobacco Use  . Smoking status: Never Smoker  . Smokeless tobacco: Never Used  Substance and Sexual Activity  . Alcohol use: No  . Drug use: No  . Sexual activity: Not on file  Other Topics Concern  . Not on file  Social History Narrative   Adopted by maternal grandparents   Helping care for them now   Current Outpatient Medications on File Prior to Visit  Medication Sig Dispense Refill  . acetaminophen (TYLENOL) 160 MG/5ML liquid Take by mouth every 4 (four) hours as needed for fever.      Marland Kitchen amphetamine-dextroamphetamine (ADDERALL XR) 20 MG 24 hr capsule Take 1 capsule (20 mg total) by mouth every morning. 30 capsule 0  . clindamycin (CLEOCIN) 300 MG capsule Take 1 capsule (300 mg total) by mouth 2 (two) times daily. X 10 weeks or until derm appt 60 capsule 2  . clindamycin-benzoyl peroxide (BENZACLIN) gel Apply 1 application topically every morning.    . clindamycin-tretinoin (ZIANA) gel Apply 1 application topically at bedtime.    Marland Kitchen HUMIRA PEN 40 MG/0.4ML PNKT     . ketoconazole (NIZORAL) 2 % shampoo Apply 1 application topically 2 (two) times a week. 120 mL 11  . Norgestimate-Ethinyl Estradiol Triphasic (TRI-SPRINTEC) 0.18/0.215/0.25 MG-35 MCG tablet Take 1 tablet by mouth daily.    Marland Kitchen triamcinolone ointment (KENALOG) 0.1 %      No current facility-administered medications on file prior to visit.     Objective:   Physical Exam  BP 110/68 (BP Location: Left Arm, Patient Position: Sitting, Cuff Size: Normal)   Pulse 82   Temp 99 F (37.2 C) (Oral)   Ht 5' 2.5" (1.588 m)   Wt 229 lb 12.8 oz (104.2 kg)   SpO2 98%   BMI 41.36 kg/m      Assessment & Plan:  1.  Bilateral impacted cerumen - Apply mineral oil on cotton ball and place in each ear fo 5 minutes weekly. You can also use a tissue to clean ears. Avoid use of Q-Tips

## 2017-06-02 NOTE — Progress Notes (Signed)
   Subjective:    Patient ID: Carly Mclaughlin, female    DOB: 01-07-1996, 22 y.o.   MRN: [id number]  HPI This is a 22 yo female who presents today with cerumen impaction. She is accompanied by her mother. This is an intermittent problem for her. She just noticed that her ears felt full yesterday. Uses cotton swabs on outer part of ear only. Has history of flaking ear canals with  No pain, no decreased hearing, no fever, no chills Does not get flu vaccine.   Past Medical History:  Diagnosis Date  . ADHD (attention deficit hyperactivity disorder)   . Allergy   . Cognitive impairment   . Hidradenitis suppurativa   . 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 DENTAL SURGERY Left    Family History  Problem Relation Age of Onset  . Multiple sclerosis Mother   . Diabetes Other   . Heart disease Other   . Hypertension Other   . Cancer Other    Social History   Tobacco Use  . Smoking status: Never Smoker  . Smokeless tobacco: Never Used  Substance Use Topics  . Alcohol use: No  . Drug use: No      Review of Systems Per HPI    Objective:   Physical Exam  Constitutional: She appears well-developed and well-nourished. No distress.  HENT:  Head: Normocephalic and atraumatic.  Right Ear: Tympanic membrane and external ear normal.  Left Ear: Tympanic membrane and external ear normal.  Moderate amount brown cerumen removed by CMA with good results. Some flaking of canals.   Eyes: Conjunctivae are normal.  Cardiovascular: Normal rate.  Pulmonary/Chest: Effort normal.  Skin: She is not diaphoretic.  Vitals reviewed.     BP 110/68 (BP Location: Left Arm, Patient Position: Sitting, Cuff Size: Normal)   Pulse 82   Temp 99 F (37.2 C) (Oral)   Ht 5' 2.5" (1.588 m)   Wt 229 lb 12.8 oz (104.2 kg)   SpO2 98%   BMI 41.36 kg/m  Wt Readings from Last 3 Encounters:  06/02/17 229 lb 12.8 oz (104.2 kg)  02/21/17 228 lb 8 oz (103.6 kg)    01/18/17 225 lb (102.1 kg)       Assessment & Plan:  1. Bilateral impacted cerumen - good results with removal - Provided written and verbal information regarding diagnosis and treatment. - encouraged her to use cream she already has for flaking in canals - can try weekly application of mineral oil on cottonball to help keep cerumen soft - RTC precautions reviewed.    Olean Reeeborah Sumiye Hirth, FNP-BC  [facility], [place]  06/02/2017 5:25 PM

## 2018-01-22 ENCOUNTER — Encounter: Payer: Self-pay | Admitting: Internal Medicine

## 2018-01-22 ENCOUNTER — Ambulatory Visit (INDEPENDENT_AMBULATORY_CARE_PROVIDER_SITE_OTHER): Admitting: Internal Medicine

## 2018-01-22 VITALS — BP 108/70 | HR 88 | Temp 98.2°F | Ht 62.75 in | Wt 227.0 lb

## 2018-01-22 DIAGNOSIS — F900 Attention-deficit hyperactivity disorder, predominantly inattentive type: Secondary | ICD-10-CM | POA: Diagnosis not present

## 2018-01-22 DIAGNOSIS — Z23 Encounter for immunization: Secondary | ICD-10-CM | POA: Diagnosis not present

## 2018-01-22 DIAGNOSIS — L732 Hidradenitis suppurativa: Secondary | ICD-10-CM

## 2018-01-22 DIAGNOSIS — Z0000 Encounter for general adult medical examination without abnormal findings: Secondary | ICD-10-CM

## 2018-01-22 NOTE — Assessment & Plan Note (Signed)
Uses the adderall rarely

## 2018-01-22 NOTE — Progress Notes (Signed)
Subjective:    Patient ID: Carly Mclaughlin, female    DOB: 1995/11/22, 22 y.o.   MRN: [id number]  HPI Here for physical With mom  Now on humira for hydradenitis  By Dr Roseanne Kaufman This is working well---has cut down on the pain Scheduled to go to specialist---awaiting an appt  Uses the adderall prn Probably no more than once a month or so  Still on OCP Has not been back to the gyn  Trying to stay active--helping around the house Weight is stable  Current Outpatient Medications on File Prior to Visit  Medication Sig Dispense Refill  . acetaminophen (TYLENOL) 160 MG/5ML liquid Take by mouth every 4 (four) hours as needed for fever.    Marland Kitchen amphetamine-dextroamphetamine (ADDERALL XR) 20 MG 24 hr capsule Take 1 capsule (20 mg total) by mouth every morning. 30 capsule 0  . clindamycin-benzoyl peroxide (BENZACLIN) gel Apply 1 application topically every morning.    . clindamycin-tretinoin (ZIANA) gel Apply 1 application topically at bedtime.    Marland Kitchen HUMIRA PEN 40 MG/0.4ML PNKT 80 mg once a week.     Marland Kitchen ketoconazole (NIZORAL) 2 % shampoo Apply 1 application topically 2 (two) times a week. 120 mL 11  . Norgestimate-Ethinyl Estradiol Triphasic (TRI-SPRINTEC) 0.18/0.215/0.25 MG-35 MCG tablet Take 1 tablet by mouth daily.    Marland Kitchen triamcinolone ointment (KENALOG) 0.1 %      No current facility-administered medications on file prior to visit.     Allergies  Allergen Reactions  . Sulfa Antibiotics Nausea Only    Past Medical History:  Diagnosis Date  . ADHD (attention deficit hyperactivity disorder)   . Allergy   . Cognitive impairment   . Hidradenitis suppurativa   . 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 DENTAL SURGERY Left     Family History  Problem Relation Age of Onset  . Multiple sclerosis Mother   . Diabetes Other   . Heart disease Other   . Hypertension Other   . Cancer Other     Social History   Socioeconomic History  .  Marital status: Single    Spouse name: Not on file  . Number of children: Not on file  . Years of education: Not on file  . Highest education level: Not on file  Occupational History  . Not on file  Social Needs  . Financial resource strain: Not on file  . Food insecurity:    Worry: Not on file    Inability: Not on file  . Transportation needs:    Medical: Not on file    Non-medical: Not on file  Tobacco Use  . Smoking status: Never Smoker  . Smokeless tobacco: Never Used  Substance and Sexual Activity  . Alcohol use: No  . Drug use: No  . Sexual activity: Not on file  Lifestyle  . Physical activity:    Days per week: Not on file    Minutes per session: Not on file  . Stress: Not on file  Relationships  . Social connections:    Talks on phone: Not on file    Gets together: Not on file    Attends religious service: Not on file    Active member of club or organization: Not on file    Attends meetings of clubs or organizations: Not on file    Relationship status: Not on file  . Intimate partner violence:    Fear of current or ex partner: Not  on file    Emotionally abused: Not on file    Physically abused: Not on file    Forced sexual activity: Not on file  Other Topics Concern  . Not on file  Social History Narrative   Adopted by maternal grandparents   Helping care for them now   Review of Systems  Constitutional: Negative for fatigue and unexpected weight change.       Wears seat belt  HENT: Negative for dental problem, hearing loss, tinnitus and trouble swallowing.        Keeps up with dentist  Eyes: Negative for visual disturbance.       No diplopia or unilateral vision loss  Respiratory: Positive for cough. Negative for chest tightness and shortness of breath.   Cardiovascular: Negative for chest pain, palpitations and leg swelling.  Gastrointestinal: Negative for blood in stool.       Tends to have loose stools after eating ---tomatoes and lettuce  especially. Pepto helps No heartburn  Endocrine: Negative for polydipsia and polyuria.  Genitourinary: Negative for dysuria and hematuria.       Periods regular  Musculoskeletal: Negative for arthralgias and joint swelling.       Some low back pain at times---positional and brief  Skin:       HS and acne  Allergic/Immunologic: Positive for environmental allergies. Negative for immunocompromised state.       Zyrtec in the past  Neurological: Positive for headaches. Negative for dizziness, syncope and light-headedness.  Hematological: Negative for adenopathy. Does not bruise/bleed easily.  Psychiatric/Behavioral: Negative for dysphoric mood and sleep disturbance. The patient is not nervous/anxious.        Objective:   Physical Exam  Constitutional: She is oriented to person, place, and time. She appears well-developed. No distress.  HENT:  Head: Normocephalic and atraumatic.  Right Ear: External ear normal.  Left Ear: External ear normal.  Mouth/Throat: Oropharynx is clear and moist. No oropharyngeal exudate.  Eyes: Pupils are equal, round, and reactive to light. Conjunctivae are normal.  Neck: No thyromegaly present.  Cardiovascular: Normal rate, regular rhythm, normal heart sounds and intact distal pulses. Exam reveals no gallop.  No murmur heard. Respiratory: Effort normal and breath sounds normal. No respiratory distress. She has no wheezes. She has no rales.  GI: Soft. There is no tenderness.  Musculoskeletal: She exhibits no edema or tenderness.  Lymphadenopathy:    She has no cervical adenopathy.  Neurological: She is alert and oriented to person, place, and time.  Skin: No erythema.  Psychiatric: She has a normal mood and affect. Her behavior is normal.           Assessment & Plan:

## 2018-01-22 NOTE — Assessment & Plan Note (Signed)
Healthy but again discussed exercise and healthy eating Urged her to have the flu vaccine with her humira Rx

## 2018-01-22 NOTE — Assessment & Plan Note (Signed)
Good control with humira

## 2018-01-22 NOTE — Addendum Note (Signed)
Addended by: Eual Fines on: 01/22/2018 12:13 PM   Modules accepted: Orders

## 2018-10-03 ENCOUNTER — Other Ambulatory Visit: Payer: Self-pay | Admitting: Internal Medicine

## 2018-12-20 ENCOUNTER — Other Ambulatory Visit: Payer: Self-pay | Admitting: Internal Medicine

## 2019-01-25 ENCOUNTER — Telehealth: Payer: Self-pay

## 2019-01-25 ENCOUNTER — Other Ambulatory Visit: Payer: Self-pay

## 2019-01-25 ENCOUNTER — Encounter: Payer: Self-pay | Admitting: Internal Medicine

## 2019-01-25 ENCOUNTER — Ambulatory Visit (INDEPENDENT_AMBULATORY_CARE_PROVIDER_SITE_OTHER): Admitting: Internal Medicine

## 2019-01-25 VITALS — BP 118/70 | HR 81 | Temp 98.3°F | Ht 62.75 in | Wt 230.0 lb

## 2019-01-25 DIAGNOSIS — Z0000 Encounter for general adult medical examination without abnormal findings: Secondary | ICD-10-CM | POA: Diagnosis not present

## 2019-01-25 DIAGNOSIS — L732 Hidradenitis suppurativa: Secondary | ICD-10-CM

## 2019-01-25 DIAGNOSIS — Z23 Encounter for immunization: Secondary | ICD-10-CM

## 2019-01-25 DIAGNOSIS — F900 Attention-deficit hyperactivity disorder, predominantly inattentive type: Secondary | ICD-10-CM

## 2019-01-25 MED ORDER — AMPHETAMINE-DEXTROAMPHET ER 20 MG PO CP24: 20.0000 mg | ORAL_CAPSULE | ORAL | 0 refills | Status: AC

## 2019-01-25 NOTE — Addendum Note (Signed)
Addended by: Pilar Grammes on: 01/25/2019 12:38 PM   Modules accepted: Orders

## 2019-01-25 NOTE — Telephone Encounter (Signed)
Pt was seen today.

## 2019-01-25 NOTE — Telephone Encounter (Signed)
[facility] Night - Client Nonclinical Telephone Record [facility] Client [place] Night - Client Client Site [place] Physician Viviana Simpler - MD Contact Type Call Who Is Calling Patient / Member / Family / Caregiver Caller Name Harr[place] Phone Number [phone] Patient Name Carly Mclaughlin Patient DOB 05/16/1995 Call Type Message Only Information Provided Reason for Call Request for General Office Information Initial Comment Caller states her daughter has an appointment tomorrow and states she will be coming with her daughter to her appointment and would like that confirmed. Additional Comment Call Closed By: Hassel Neth Transaction Date/Time: 01/24/2019 6:21:25 PM (ET)

## 2019-01-25 NOTE — Assessment & Plan Note (Signed)
Now on remicade

## 2019-01-25 NOTE — Addendum Note (Signed)
Addended by: Viviana Simpler I on: 01/25/2019 01:16 PM   Modules accepted: Orders

## 2019-01-25 NOTE — Assessment & Plan Note (Signed)
Will restart adderall for prn use (not every day)

## 2019-01-25 NOTE — Patient Instructions (Signed)

## 2019-01-25 NOTE — Assessment & Plan Note (Addendum)
Again discussed fitness and healthy eating Flu vaccine today Still virgin--discussed safe sex. Will still defer PAP

## 2019-01-25 NOTE — Progress Notes (Signed)
Subjective:    Patient ID: Carly Mclaughlin, female    DOB: 1995/11/24, 23 y.o.   MRN: [id number]  HPI Here for physical  No longer taking humira Now on remicade since August--hopes to spread these out to every 2 months  Mom notices some trouble with comprehension, following directions Wants to consider restarting the adderall Still not working--her choice Mom facing back surgery--will be the care giver  Current Outpatient Medications on File Prior to Visit  Medication Sig Dispense Refill  . acetaminophen (TYLENOL) 160 MG/5ML liquid Take by mouth every 4 (four) hours as needed for fever.    . clindamycin-benzoyl peroxide (BENZACLIN) gel Apply 1 application topically every morning.    . clindamycin-tretinoin (ZIANA) gel Apply 1 application topically at bedtime.    . inFLIXimab (REMICADE IV) Inject into the vein. Every 6-8 weeks    . ketoconazole (NIZORAL) 2 % shampoo APPLY TOPICALLY TWO TIMES A WEEK AS DIRECTED 120 mL 11  . Norgestimate-Ethinyl Estradiol Triphasic (TRI-SPRINTEC) 0.18/0.215/0.25 MG-35 MCG tablet Take 1 tablet by mouth daily.    Marland Kitchen triamcinolone ointment (KENALOG) 0.1 %      No current facility-administered medications on file prior to visit.     Allergies  Allergen Reactions  . Sulfa Antibiotics Nausea Only    Past Medical History:  Diagnosis Date  . ADHD (attention deficit hyperactivity disorder)   . Allergy   . Cognitive impairment   . Hidradenitis suppurativa   . 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 DENTAL SURGERY Left     Family History  Problem Relation Age of Onset  . Multiple sclerosis Mother   . Diabetes Other   . Heart disease Other   . Hypertension Other   . Cancer Other     Social History   Socioeconomic History  . Marital status: Single    Spouse name: Not on file  . Number of children: Not on file  . Years of education: Not on file  . Highest education level: Not on file  Occupational  History  . Not on file  Social Needs  . Financial resource strain: Not on file  . Food insecurity    Worry: Not on file    Inability: Not on file  . Transportation needs    Medical: Not on file    Non-medical: Not on file  Tobacco Use  . Smoking status: Never Smoker  . Smokeless tobacco: Never Used  Substance and Sexual Activity  . Alcohol use: No  . Drug use: No  . Sexual activity: Not on file  Lifestyle  . Physical activity    Days per week: Not on file    Minutes per session: Not on file  . Stress: Not on file  Relationships  . Social Herbalist on phone: Not on file    Gets together: Not on file    Attends religious service: Not on file    Active member of club or organization: Not on file    Attends meetings of clubs or organizations: Not on file    Relationship status: Not on file  . Intimate partner violence    Fear of current or ex partner: Not on file    Emotionally abused: Not on file    Physically abused: Not on file    Forced sexual activity: Not on file  Other Topics Concern  . Not on file  Social History Narrative  Review of Systems  Constitutional: Negative for fatigue.       Weight about the same Wears seat belt  HENT: Positive for tinnitus. Negative for dental problem and hearing loss.        Has had a couple of nosebleeds Generally keeps up with dentist  Eyes: Negative for visual disturbance.       No diplopia or unilateral vision loss  Respiratory: Negative for cough, chest tightness and shortness of breath.   Cardiovascular: Positive for leg swelling. Negative for chest pain and palpitations.  Gastrointestinal: Negative for blood in stool.       Some mild IBS type symptoms --not very often No heartburn  Endocrine: Negative for polydipsia and polyuria.  Genitourinary: Negative for dysuria and hematuria.       Periods may be early despite the OCP  Musculoskeletal: Negative for arthralgias, back pain and joint swelling.  Skin:        Still sees Dr Roseanne Kaufman  Allergic/Immunologic: Positive for environmental allergies. Negative for immunocompromised state.       Uses cetirizine prn  Neurological: Positive for headaches. Negative for dizziness, syncope and light-headedness.  Hematological: Negative for adenopathy. Does not bruise/bleed easily.  Psychiatric/Behavioral: Negative for dysphoric mood and sleep disturbance.       Occ anxiety       Objective:   Physical Exam  Constitutional: She is oriented to person, place, and time. She appears well-developed. No distress.  HENT:  Head: Normocephalic and atraumatic.  Right Ear: External ear normal.  Left Ear: External ear normal.  Mouth/Throat: Oropharynx is clear and moist. No oropharyngeal exudate.  Eyes: Pupils are equal, round, and reactive to light. Conjunctivae are normal.  Neck: No thyromegaly present.  Cardiovascular: Normal rate, regular rhythm, normal heart sounds and intact distal pulses. Exam reveals no gallop.  No murmur heard. Respiratory: Effort normal and breath sounds normal. No respiratory distress. She has no wheezes. She has no rales.  GI: Soft. There is no abdominal tenderness.  Musculoskeletal:        General: No tenderness or edema.  Lymphadenopathy:    She has no cervical adenopathy.  Neurological: She is alert and oriented to person, place, and time.  Skin: No rash noted. No erythema.  Psychiatric: She has a normal mood and affect. Her behavior is normal.           Assessment & Plan:

## 2019-03-05 ENCOUNTER — Other Ambulatory Visit: Payer: Self-pay

## 2019-03-05 ENCOUNTER — Encounter: Payer: Self-pay | Admitting: Internal Medicine

## 2019-03-05 ENCOUNTER — Ambulatory Visit (INDEPENDENT_AMBULATORY_CARE_PROVIDER_SITE_OTHER): Admitting: Internal Medicine

## 2019-03-05 DIAGNOSIS — J029 Acute pharyngitis, unspecified: Secondary | ICD-10-CM

## 2019-03-05 NOTE — Progress Notes (Signed)
Subjective:    Patient ID: Carly Mclaughlin, female    DOB: May 09, 1995, 23 y.o.   MRN: [id number]  HPI Video virtual visit for sore throat Identification done Discussed billing and limitations and she gave consent Participants---patient in her home, I am in my office  Has sore throat --seems to be off an on and more prominent at night Then seemed much "stronger" 2 days ago Tried drinking but it still lingered No fever  Tried some hot chocolate and that felt good Tried nyquil at first and mucinex "all in one"---?slight help Feels mucus in there at times No SOB No chest or nasal congestion No ear pain  Current Outpatient Medications on File Prior to Visit  Medication Sig Dispense Refill  . acetaminophen (TYLENOL) 160 MG/5ML liquid Take by mouth every 4 (four) hours as needed for fever.    Marland Kitchen amphetamine-dextroamphetamine (ADDERALL XR) 20 MG 24 hr capsule Take 1 capsule (20 mg total) by mouth every morning. 30 capsule 0  . clindamycin-benzoyl peroxide (BENZACLIN) gel Apply 1 application topically every morning.    . clindamycin-tretinoin (ZIANA) gel Apply 1 application topically at bedtime.    . folic acid (FOLVITE) 1 MG tablet     . inFLIXimab (REMICADE IV) Inject into the vein. Every 6-8 weeks    . ketoconazole (NIZORAL) 2 % shampoo APPLY TOPICALLY TWO TIMES A WEEK AS DIRECTED 120 mL 11  . methotrexate (RHEUMATREX) 2.5 MG tablet TAKE 6 TABLETS ON ONE DAY EVERY WEEK    . Norgestimate-Ethinyl Estradiol Triphasic (TRI-SPRINTEC) 0.18/0.215/0.25 MG-35 MCG tablet Take 1 tablet by mouth daily.    Marland Kitchen triamcinolone ointment (KENALOG) 0.1 %      No current facility-administered medications on file prior to visit.     Allergies  Allergen Reactions  . Sulfa Antibiotics Nausea Only    Past Medical History:  Diagnosis Date  . ADHD (attention deficit hyperactivity disorder)   . Allergy   . Cognitive impairment   . Hidradenitis suppurativa   . 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)     Past Surgical  History:  Procedure Laterality Date  . DENTAL SURGERY Left     Family History  Problem Relation Age of Onset  . Multiple sclerosis Mother   . Diabetes Other   . Heart disease Other   . Hypertension Other   . Cancer Other     Social History   Socioeconomic History  . Marital status: Single    Spouse name: Not on file  . Number of children: Not on file  . Years of education: Not on file  . Highest education level: Not on file  Occupational History  . Not on file  Social Needs  . Financial resource strain: Not on file  . Food insecurity    Worry: Not on file    Inability: Not on file  . Transportation needs    Medical: Not on file    Non-medical: Not on file  Tobacco Use  . Smoking status: Never Smoker  . Smokeless tobacco: Never Used  Substance and Sexual Activity  . Alcohol use: No  . Drug use: No  . Sexual activity: Not on file  Lifestyle  . Physical activity    Days per week: Not on file    Minutes per session: Not on file  . Stress: Not on file  Relationships  . Social Musician on phone: Not on file    Gets together: Not on file    Attends religious service: Not  on file    Active member of club or organization: Not on file    Attends meetings of clubs or organizations: Not on file    Relationship status: Not on file  . Intimate partner violence    Fear of current or ex partner: Not on file    Emotionally abused: Not on file    Physically abused: Not on file    Forced sexual activity: Not on file  Other Topics Concern  . Not on file  Social History Narrative      Review of Systems No decrease in smell or taste No N/V No diarrhea No apparent swollen neck glands No rash Stays home--no known COVID exposure (uses mask)    Objective:   Physical Exam  Constitutional: She appears well-developed. No distress.  HENT:  Pharynx is not particularly red No exudates and tonsils not enlarged No petechiae  (she used flashlight to facilitate my  exam)  Respiratory: Effort normal. No respiratory distress.           Assessment & Plan:

## 2019-03-05 NOTE — Assessment & Plan Note (Signed)
Symptoms and exam don't suggest strep Likely other viral infection Discussed staying in for 10 days and until symptoms are improved (so ~7 days from now) Consider COVID testing if cough, SOB or fever Continue symptomatic care

## 2019-03-11 ENCOUNTER — Telehealth: Payer: Self-pay | Admitting: Internal Medicine

## 2019-03-11 MED ORDER — AMOXICILLIN 500 MG PO TABS: 1000.0000 mg | ORAL_TABLET | Freq: Two times a day (BID) | ORAL | 0 refills | Status: AC

## 2019-03-11 NOTE — Telephone Encounter (Signed)
Let her know that I sent in an antibiotic for her to try. If still no better, she may need to seek care at an urgent care

## 2019-03-11 NOTE — Telephone Encounter (Signed)
Spoke to pt's mom  .

## 2019-03-11 NOTE — Telephone Encounter (Signed)
Patient had a virtual visit with Dr.Letvak last week.  Patient's sore throat isn't better.  She doesn't have a fever.Patient uses [facility].

## 2019-04-03 ENCOUNTER — Ambulatory Visit: Attending: Internal Medicine

## 2019-04-03 DIAGNOSIS — Z20822 Contact with and (suspected) exposure to covid-19: Secondary | ICD-10-CM

## 2019-04-05 LAB — NOVEL CORONAVIRUS, NAA: SARS-CoV-2, NAA: NOT DETECTED

## 2019-04-09 ENCOUNTER — Ambulatory Visit: Attending: Internal Medicine

## 2019-04-09 DIAGNOSIS — Z20822 Contact with and (suspected) exposure to covid-19: Secondary | ICD-10-CM

## 2019-04-11 LAB — NOVEL CORONAVIRUS, NAA: SARS-CoV-2, NAA: NOT DETECTED

## 2019-04-19 ENCOUNTER — Ambulatory Visit: Attending: Internal Medicine

## 2019-04-19 DIAGNOSIS — Z20822 Contact with and (suspected) exposure to covid-19: Secondary | ICD-10-CM

## 2019-04-20 LAB — NOVEL CORONAVIRUS, NAA: SARS-CoV-2, NAA: NOT DETECTED

## 2019-04-22 ENCOUNTER — Telehealth: Payer: Self-pay

## 2019-04-22 NOTE — Telephone Encounter (Signed)
Thomasenia Sales, patient's mom, (patient was beside her on the phone also) called stating they are trying to get an appointment with Dr Andee Poles and before patient can be scheduled their office needs to get copy of recent COVID test. Their fax number is [phone]. Is ok to fax this over to Dr Vaught's office?

## 2019-04-22 NOTE — Telephone Encounter (Signed)
Yes--you can send him a copy of her COVID test

## 2019-04-23 NOTE — Telephone Encounter (Signed)
Noted  

## 2019-04-23 NOTE — Telephone Encounter (Signed)
Thomasenia Sales advised and result faxed over to Dr Andee Poles

## 2019-05-03 ENCOUNTER — Telehealth: Payer: Self-pay | Admitting: Internal Medicine

## 2019-05-03 ENCOUNTER — Telehealth: Payer: Self-pay

## 2019-05-03 NOTE — Telephone Encounter (Signed)
Patient's mother called. She stated that she was needing the patient's covid results faxed to the ENT but they keep saying they have no received this. Patient's mother stated she spoke with Nehemiah Settle and Zella Ball about this.   She is wanting these printed for her to pick up on Monday morning to take to the ENT   Please advise

## 2019-05-03 NOTE — Telephone Encounter (Signed)
error 

## 2019-05-03 NOTE — Telephone Encounter (Signed)
Test results printed and placed up front for pickup

## 2020-01-27 ENCOUNTER — Ambulatory Visit (INDEPENDENT_AMBULATORY_CARE_PROVIDER_SITE_OTHER): Admitting: Internal Medicine

## 2020-01-27 ENCOUNTER — Other Ambulatory Visit: Payer: Self-pay

## 2020-01-27 ENCOUNTER — Encounter: Payer: Self-pay | Admitting: Internal Medicine

## 2020-01-27 VITALS — BP 110/70 | HR 90 | Temp 97.8°F | Ht 63.0 in | Wt 229.0 lb

## 2020-01-27 DIAGNOSIS — Z0000 Encounter for general adult medical examination without abnormal findings: Secondary | ICD-10-CM

## 2020-01-27 DIAGNOSIS — Z23 Encounter for immunization: Secondary | ICD-10-CM | POA: Diagnosis not present

## 2020-01-27 DIAGNOSIS — K580 Irritable bowel syndrome with diarrhea: Secondary | ICD-10-CM

## 2020-01-27 DIAGNOSIS — K589 Irritable bowel syndrome without diarrhea: Secondary | ICD-10-CM | POA: Insufficient documentation

## 2020-01-27 DIAGNOSIS — L732 Hidradenitis suppurativa: Secondary | ICD-10-CM

## 2020-01-27 MED ORDER — HYDROCORTISONE 2.5 % EX CREA: TOPICAL_CREAM | Freq: Three times a day (TID) | CUTANEOUS | 3 refills | Status: AC | PRN

## 2020-01-27 NOTE — Progress Notes (Signed)
Subjective:    Patient ID: Carly Mclaughlin, female    DOB: 07/11/1995, 24 y.o.   MRN: [id number]  HPI Here with mom for PE This visit occurred during the SARS-CoV-2 public health emergency.  Safety protocols were in place, including screening questions prior to the visit, additional usage of staff PPE, and extensive cleaning of exam room while observing appropriate contact time as indicated for disinfecting solutions.   Ongoing issues with hidradenitis  Now on MTX in addition to the remicade  Has episodic problems with her bowels No clear food that provokes it Some rectal urgency after eating---sporadic (will go 3-4 times) Gets sense at times that some stool may be stuck in there and something may be sticking out (has tried preparation H with some success) OTC imodium will help  Some blood on the wipes No N/V Appetite is good Weight is stable  Current Outpatient Medications on File Prior to Visit  Medication Sig Dispense Refill  . acetaminophen (TYLENOL) 160 MG/5ML liquid Take by mouth every 4 (four) hours as needed for fever.    Marland Kitchen amphetamine-dextroamphetamine (ADDERALL XR) 20 MG 24 hr capsule Take 1 capsule (20 mg total) by mouth every morning. 30 capsule 0  . clindamycin-benzoyl peroxide (BENZACLIN) gel Apply 1 application topically every morning.    . clindamycin-tretinoin (ZIANA) gel Apply 1 application topically at bedtime.    . folic acid (FOLVITE) 1 MG tablet     . inFLIXimab (REMICADE IV) Inject into the vein. Every 4 weeks    . ketoconazole (NIZORAL) 2 % shampoo APPLY TOPICALLY TWO TIMES A WEEK AS DIRECTED 120 mL 11  . methotrexate (RHEUMATREX) 2.5 MG tablet TAKE 6 TABLETS ON ONE DAY EVERY WEEK    . Norgestimate-Ethinyl Estradiol Triphasic (TRI-SPRINTEC) 0.18/0.215/0.25 MG-35 MCG tablet Take 1 tablet by mouth daily.    Marland Kitchen triamcinolone ointment (KENALOG) 0.1 %      No current facility-administered medications on file prior to visit.    Allergies  Allergen Reactions    . Sulfa Antibiotics Nausea Only    Past Medical History:  Diagnosis Date  . ADHD (attention deficit hyperactivity disorder)   . Allergy   . Cognitive impairment   . Hidradenitis suppurativa   . 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 DENTAL SURGERY Left     Family History  Problem Relation Age of Onset  . Multiple sclerosis Mother   . Diabetes Other   . Heart disease Other   . Hypertension Other   . Cancer Other     Social History   Socioeconomic History  . Marital status: Single    Spouse name: Not on file  . Number of children: Not on file  . Years of education: Not on file  . Highest education level: Not on file  Occupational History  . Not on file  Tobacco Use  . Smoking status: Never Smoker  . Smokeless tobacco: Never Used  Substance and Sexual Activity  . Alcohol use: No  . Drug use: No  . Sexual activity: Not on file  Other Topics Concern  . Not on file  Social History Narrative      Social Determinants of Health   Financial Resource Strain:   . Difficulty of Paying Living Expenses: Not on file  Food Insecurity:   . Worried About Programme researcher, broadcasting/film/video in the Last Year: Not on file  . Ran Out of Food in the Last Year: Not on  file  Transportation Needs:   . Freight forwarder (Medical): Not on file  . Lack of Transportation (Non-Medical): Not on file  Physical Activity:   . Days of Exercise per Week: Not on file  . Minutes of Exercise per Session: Not on file  Stress:   . Feeling of Stress : Not on file  Social Connections:   . Frequency of Communication with Friends and Family: Not on file  . Frequency of Social Gatherings with Friends and Family: Not on file  . Attends Religious Services: Not on file  . Active Member of Clubs or Organizations: Not on file  . Attends Banker Meetings: Not on file  . Marital Status: Not on file  Intimate Partner Violence:   . Fear of Current or  Ex-Partner: Not on file  . Emotionally Abused: Not on file  . Physically Abused: Not on file  . Sexually Abused: Not on file   Review of Systems  Constitutional: Negative for fatigue and unexpected weight change.       Wears seat belt  HENT: Positive for tinnitus. Negative for dental problem and hearing loss.        Keeps up with dentist  Eyes: Negative for visual disturbance.       No diplopia or unilateral vision loss  Respiratory: Negative for cough, chest tightness and shortness of breath.   Cardiovascular: Negative for chest pain, palpitations and leg swelling.  Gastrointestinal: Positive for diarrhea. Negative for abdominal pain.  Endocrine: Negative for polydipsia and polyuria.  Genitourinary: Negative for dysuria and hematuria.       No sexual relationship  Musculoskeletal: Positive for back pain. Negative for arthralgias and joint swelling.       Tylenol helps occasional back pain  Skin: Negative for rash.  Allergic/Immunologic: Positive for environmental allergies. Negative for immunocompromised state.       Uses OTC med with success  Neurological: Positive for headaches. Negative for dizziness, syncope and light-headedness.  Hematological: Negative for adenopathy. Does not bruise/bleed easily.  Psychiatric/Behavioral: Negative for sleep disturbance.       Occasional anxiety/depression---but only part of a day and no anhedonia       Objective:   Physical Exam Constitutional:      Appearance: Normal appearance.  HENT:     Right Ear: External ear normal.     Left Ear: External ear normal.     Ears:     Comments: Cerumen bilaterally clogging canals    Mouth/Throat:     Pharynx: No oropharyngeal exudate or posterior oropharyngeal erythema.  Eyes:     Conjunctiva/sclera: Conjunctivae normal.     Pupils: Pupils are equal, round, and reactive to light.  Cardiovascular:     Rate and Rhythm: Normal rate and regular rhythm.     Pulses: Normal pulses.     Heart sounds:  No murmur heard.  No gallop.   Pulmonary:     Effort: Pulmonary effort is normal.     Breath sounds: Normal breath sounds. No wheezing or rales.  Abdominal:     Palpations: Abdomen is soft.     Tenderness: There is no abdominal tenderness.  Genitourinary:    Comments: Small area of swelling between rectum and vagina--not clearly hemorrhoid Perirectal redness without fistula (discussed using HC cream) Musculoskeletal:     Cervical back: Neck supple.     Right lower leg: No edema.     Left lower leg: No edema.  Lymphadenopathy:     Cervical: No  cervical adenopathy.  Skin:    General: Skin is warm.     Findings: No rash.  Neurological:     General: No focal deficit present.     Mental Status: She is alert and oriented to person, place, and time.  Psychiatric:        Mood and Affect: Mood normal.        Behavior: Behavior normal.            Assessment & Plan:

## 2020-01-27 NOTE — Assessment & Plan Note (Addendum)
Healthy but needs to work on healthy behaviors DASH info given Discussed COVID vaccine--please get Flu vaccine given

## 2020-01-27 NOTE — Assessment & Plan Note (Signed)
Doing okay with immunosuppressive regimen

## 2020-01-27 NOTE — Assessment & Plan Note (Signed)
Fairly classic symptoms Does well with once or twice a week imodium

## 2020-01-27 NOTE — Addendum Note (Signed)
Addended by: Eual Fines on: 01/27/2020 03:09 PM   Modules accepted: Orders

## 2020-01-27 NOTE — Patient Instructions (Signed)
DASH Eating Plan DASH stands for "Dietary Approaches to Stop Hypertension." The DASH eating plan is a healthy eating plan that has been shown to reduce high blood pressure (hypertension). It may also reduce your risk for type 2 diabetes, heart disease, and stroke. The DASH eating plan may also help with weight loss. What are tips for following this plan?  General guidelines  Avoid eating more than 2,300 mg (milligrams) of salt (sodium) a day. If you have hypertension, you may need to reduce your sodium intake to 1,500 mg a day.  Limit alcohol intake to no more than 1 drink a day for nonpregnant women and 2 drinks a day for men. One drink equals 12 oz of beer, 5 oz of wine, or 1 oz of hard liquor.  Work with your health care provider to maintain a healthy body weight or to lose weight. Ask what an ideal weight is for you.  Get at least 30 minutes of exercise that causes your heart to beat faster (aerobic exercise) most days of the week. Activities may include walking, swimming, or biking.  Work with your health care provider or diet and nutrition specialist (dietitian) to adjust your eating plan to your individual calorie needs. Reading food labels   Check food labels for the amount of sodium per serving. Choose foods with less than 5 percent of the Daily Value of sodium. Generally, foods with less than 300 mg of sodium per serving fit into this eating plan.  To find whole grains, look for the word "whole" as the first word in the ingredient list. Shopping  Buy products labeled as "low-sodium" or "no salt added."  Buy fresh foods. Avoid canned foods and premade or frozen meals. Cooking  Avoid adding salt when cooking. Use salt-free seasonings or herbs instead of table salt or sea salt. Check with your health care provider or pharmacist before using salt substitutes.  Do not fry foods. Cook foods using healthy methods such as baking, boiling, grilling, and broiling instead.  Cook with  heart-healthy oils, such as olive, canola, soybean, or sunflower oil. Meal planning  Eat a balanced diet that includes: ? 5 or more servings of fruits and vegetables each day. At each meal, try to fill half of your plate with fruits and vegetables. ? Up to 6-8 servings of whole grains each day. ? Less than 6 oz of lean meat, poultry, or fish each day. A 3-oz serving of meat is about the same size as a deck of cards. One egg equals 1 oz. ? 2 servings of low-fat dairy each day. ? A serving of nuts, seeds, or beans 5 times each week. ? Heart-healthy fats. Healthy fats called Omega-3 fatty acids are found in foods such as flaxseeds and coldwater fish, like sardines, salmon, and mackerel.  Limit how much you eat of the following: ? Canned or prepackaged foods. ? Food that is high in trans fat, such as fried foods. ? Food that is high in saturated fat, such as fatty meat. ? Sweets, desserts, sugary drinks, and other foods with added sugar. ? Full-fat dairy products.  Do not salt foods before eating.  Try to eat at least 2 vegetarian meals each week.  Eat more home-cooked food and less restaurant, buffet, and fast food.  When eating at a restaurant, ask that your food be prepared with less salt or no salt, if possible. What foods are recommended? The items listed may not be a complete list. Talk with your dietitian about   what dietary choices are best for you. Grains Whole-grain or whole-wheat bread. Whole-grain or whole-wheat pasta. Brown rice. Oatmeal. Quinoa. Bulgur. Whole-grain and low-sodium cereals. Pita bread. Low-fat, low-sodium crackers. Whole-wheat flour tortillas. Vegetables Fresh or frozen vegetables (raw, steamed, roasted, or grilled). Low-sodium or reduced-sodium tomato and vegetable juice. Low-sodium or reduced-sodium tomato sauce and tomato paste. Low-sodium or reduced-sodium canned vegetables. Fruits All fresh, dried, or frozen fruit. Canned fruit in natural juice (without  added sugar). Meat and other protein foods Skinless chicken or turkey. Ground chicken or turkey. Pork with fat trimmed off. Fish and seafood. Egg whites. Dried beans, peas, or lentils. Unsalted nuts, nut butters, and seeds. Unsalted canned beans. Lean cuts of beef with fat trimmed off. Low-sodium, lean deli meat. Dairy Low-fat (1%) or fat-free (skim) milk. Fat-free, low-fat, or reduced-fat cheeses. Nonfat, low-sodium ricotta or cottage cheese. Low-fat or nonfat yogurt. Low-fat, low-sodium cheese. Fats and oils Soft margarine without trans fats. Vegetable oil. Low-fat, reduced-fat, or light mayonnaise and salad dressings (reduced-sodium). Canola, safflower, olive, soybean, and sunflower oils. Avocado. Seasoning and other foods Herbs. Spices. Seasoning mixes without salt. Unsalted popcorn and pretzels. Fat-free sweets. What foods are not recommended? The items listed may not be a complete list. Talk with your dietitian about what dietary choices are best for you. Grains Baked goods made with fat, such as croissants, muffins, or some breads. Dry pasta or rice meal packs. Vegetables Creamed or fried vegetables. Vegetables in a cheese sauce. Regular canned vegetables (not low-sodium or reduced-sodium). Regular canned tomato sauce and paste (not low-sodium or reduced-sodium). Regular tomato and vegetable juice (not low-sodium or reduced-sodium). Pickles. Olives. Fruits Canned fruit in a light or heavy syrup. Fried fruit. Fruit in cream or butter sauce. Meat and other protein foods Fatty cuts of meat. Ribs. Fried meat. Bacon. Sausage. Bologna and other processed lunch meats. Salami. Fatback. Hotdogs. Bratwurst. Salted nuts and seeds. Canned beans with added salt. Canned or smoked fish. Whole eggs or egg yolks. Chicken or turkey with skin. Dairy Whole or 2% milk, cream, and half-and-half. Whole or full-fat cream cheese. Whole-fat or sweetened yogurt. Full-fat cheese. Nondairy creamers. Whipped toppings.  Processed cheese and cheese spreads. Fats and oils Butter. Stick margarine. Lard. Shortening. Ghee. Bacon fat. Tropical oils, such as coconut, palm kernel, or palm oil. Seasoning and other foods Salted popcorn and pretzels. Onion salt, garlic salt, seasoned salt, table salt, and sea salt. Worcestershire sauce. Tartar sauce. Barbecue sauce. Teriyaki sauce. Soy sauce, including reduced-sodium. Steak sauce. Canned and packaged gravies. Fish sauce. Oyster sauce. Cocktail sauce. Horseradish that you find on the shelf. Ketchup. Mustard. Meat flavorings and tenderizers. Bouillon cubes. Hot sauce and Tabasco sauce. Premade or packaged marinades. Premade or packaged taco seasonings. Relishes. Regular salad dressings. Where to find more information:  National Heart, Lung, and Blood Institute: www.nhlbi.nih.gov  American Heart Association: www.heart.org Summary  The DASH eating plan is a healthy eating plan that has been shown to reduce high blood pressure (hypertension). It may also reduce your risk for type 2 diabetes, heart disease, and stroke.  With the DASH eating plan, you should limit salt (sodium) intake to 2,300 mg a day. If you have hypertension, you may need to reduce your sodium intake to 1,500 mg a day.  When on the DASH eating plan, aim to eat more fresh fruits and vegetables, whole grains, lean proteins, low-fat dairy, and heart-healthy fats.  Work with your health care provider or diet and nutrition specialist (dietitian) to adjust your eating plan to your   individual calorie needs. This information is not intended to replace advice given to you by your health care provider. Make sure you discuss any questions you have with your health care provider. Document Revised: 02/24/2017 Document Reviewed: 03/07/2016 Elsevier Patient Education  2020 Elsevier Inc.  

## 2020-07-06 ENCOUNTER — Encounter: Payer: Self-pay | Admitting: Internal Medicine

## 2020-07-06 ENCOUNTER — Ambulatory Visit (INDEPENDENT_AMBULATORY_CARE_PROVIDER_SITE_OTHER): Admitting: Internal Medicine

## 2020-07-06 ENCOUNTER — Other Ambulatory Visit: Payer: Self-pay

## 2020-07-06 VITALS — BP 112/82 | HR 88 | Temp 97.2°F | Ht 63.0 in | Wt 237.0 lb

## 2020-07-06 DIAGNOSIS — R600 Localized edema: Secondary | ICD-10-CM

## 2020-07-06 DIAGNOSIS — R609 Edema, unspecified: Secondary | ICD-10-CM | POA: Insufficient documentation

## 2020-07-06 NOTE — Progress Notes (Signed)
Subjective:    Patient ID: Carly Mclaughlin, female    DOB: Jan 16, 1996, 25 y.o.   MRN: [id number]  HPI Here with mom due to concerns about fluid retention. This visit occurred during the SARS-CoV-2 public health emergency.  Safety protocols were in place, including screening questions prior to the visit, additional usage of staff PPE, and extensive cleaning of exam room while observing appropriate contact time as indicated for disinfecting solutions.   Mostly mom is concerned Had IV infusion last Thursday Weight has gone up when checked--before the infusion She has been on it for a while---and no problems since then  There is fluid in feet at night most of the time Gone in AM No SOB  Uses salt at times  Current Outpatient Medications on File Prior to Visit  Medication Sig Dispense Refill  . acetaminophen (TYLENOL) 160 MG/5ML liquid Take by mouth every 4 (four) hours as needed for fever.    Marland Kitchen amphetamine-dextroamphetamine (ADDERALL XR) 20 MG 24 hr capsule Take 1 capsule (20 mg total) by mouth every morning. 30 capsule 0  . clindamycin-benzoyl peroxide (BENZACLIN) gel Apply 1 application topically every morning.    . clindamycin-tretinoin (ZIANA) gel Apply 1 application topically at bedtime.    . folic acid (FOLVITE) 1 MG tablet     . hydrocortisone 2.5 % cream Apply topically 3 (three) times daily as needed. 28 g 3  . inFLIXimab (REMICADE IV) Inject into the vein. Every 4 weeks    . ketoconazole (NIZORAL) 2 % shampoo APPLY TOPICALLY TWO TIMES A WEEK AS DIRECTED 120 mL 11  . methotrexate (RHEUMATREX) 2.5 MG tablet TAKE 6 TABLETS ON ONE DAY EVERY WEEK    . TRI-LO-ESTARYLLA 0.18/0.215/0.25 MG-25 MCG tab Take 1 tablet by mouth daily.    Marland Kitchen triamcinolone ointment (KENALOG) 0.1 %      No current facility-administered medications on file prior to visit.    Allergies  Allergen Reactions  . Sulfa Antibiotics Nausea Only    Past Medical History:  Diagnosis Date  . ADHD (attention  deficit hyperactivity disorder)   . Allergy   . Cognitive impairment   . Hidradenitis suppurativa   . 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 DENTAL SURGERY Left     Family History  Problem Relation Age of Onset  . Multiple sclerosis Mother   . Diabetes Other   . Heart disease Other   . Hypertension Other   . Cancer Other     Social History   Socioeconomic History  . Marital status: Single    Spouse name: Not on file  . Number of children: Not on file  . Years of education: Not on file  . Highest education level: Not on file  Occupational History  . Not on file  Tobacco Use  . Smoking status: Never Smoker  . Smokeless tobacco: Never Used  Substance and Sexual Activity  . Alcohol use: No  . Drug use: No  . Sexual activity: Not on file  Other Topics Concern  . Not on file  Social History Narrative      Social Determinants of Health   Financial Resource Strain: Not on file  Food Insecurity: Not on file  Transportation Needs: Not on file  Physical Activity: Not on file  Stress: Not on file  Social Connections: Not on file  Intimate Partner Violence: Not on file   Review of Systems  Mom has cut back on cooking in  oil They eat out about 3 times a week---[employer], [employer]     Objective:   Physical Exam Constitutional:      Appearance: She is obese.  Cardiovascular:     Rate and Rhythm: Normal rate and regular rhythm.     Heart sounds: No murmur heard. No gallop.   Pulmonary:     Effort: Pulmonary effort is normal.     Breath sounds: Normal breath sounds. No wheezing or rales.  Musculoskeletal:     Cervical back: Neck supple.     Comments: Thick calves but no edema  Lymphadenopathy:     Cervical: No cervical adenopathy.  Neurological:     Mental Status: She is alert.            Assessment & Plan:

## 2020-07-06 NOTE — Assessment & Plan Note (Signed)
Gets evening edema--especially after infusions (which are saline) Recent labs normal No CHF Shouldn't need diuretic Discussed avoiding salt

## 2020-07-06 NOTE — Assessment & Plan Note (Signed)
Discussed healthy eating---cut out sugar in drinks, reduce eating out (and better choices), etc

## 2020-07-06 NOTE — Patient Instructions (Signed)

## 2021-01-28 ENCOUNTER — Encounter: Admitting: Internal Medicine

## 2021-05-13 ENCOUNTER — Emergency Department
Admission: EM | Admit: 2021-05-13 | Discharge: 2021-05-13 | Disposition: A | Discharge status: Home / Self Care | Attending: Emergency Medicine | Admitting: Emergency Medicine

## 2021-05-13 ENCOUNTER — Other Ambulatory Visit: Payer: Self-pay

## 2021-05-13 ENCOUNTER — Encounter: Payer: Self-pay | Admitting: Emergency Medicine

## 2021-05-13 ENCOUNTER — Emergency Department

## 2021-05-13 DIAGNOSIS — Y92007 Garden or yard of unspecified non-institutional (private) residence as the place of occurrence of the external cause: Secondary | ICD-10-CM | POA: Insufficient documentation

## 2021-05-13 DIAGNOSIS — W540XXA Bitten by dog, initial encounter: Secondary | ICD-10-CM | POA: Diagnosis not present

## 2021-05-13 DIAGNOSIS — S61411A Laceration without foreign body of right hand, initial encounter: Secondary | ICD-10-CM | POA: Diagnosis not present

## 2021-05-13 DIAGNOSIS — S6991XA Unspecified injury of right wrist, hand and finger(s), initial encounter: Secondary | ICD-10-CM | POA: Diagnosis present

## 2021-05-13 DIAGNOSIS — Z23 Encounter for immunization: Secondary | ICD-10-CM | POA: Insufficient documentation

## 2021-05-13 DIAGNOSIS — S61451A Open bite of right hand, initial encounter: Secondary | ICD-10-CM

## 2021-05-13 MED ORDER — AMOXICILLIN-POT CLAVULANATE 875-125 MG PO TABS
1.0000 | ORAL_TABLET | Freq: Once | ORAL | Status: AC
  Administered 2021-05-13: 1 via ORAL
  Filled 2021-05-13: qty 1

## 2021-05-13 MED ORDER — IBUPROFEN 800 MG PO TABS
800.0000 mg | ORAL_TABLET | Freq: Once | ORAL | Status: AC
  Administered 2021-05-13: 800 mg via ORAL
  Filled 2021-05-13: qty 1

## 2021-05-13 MED ORDER — AMOXICILLIN-POT CLAVULANATE 875-125 MG PO TABS: 1.0000 | ORAL_TABLET | Freq: Two times a day (BID) | ORAL | 0 refills | Status: AC

## 2021-05-13 MED ORDER — LIDOCAINE-EPINEPHRINE-TETRACAINE (LET) TOPICAL GEL
3.0000 mL | Freq: Once | TOPICAL | Status: AC
  Administered 2021-05-13: 3 mL via TOPICAL
  Filled 2021-05-13: qty 3

## 2021-05-13 MED ORDER — IBUPROFEN 600 MG PO TABS: 600.0000 mg | ORAL_TABLET | Freq: Four times a day (QID) | ORAL | 0 refills | Status: AC | PRN

## 2021-05-13 MED ORDER — TETANUS-DIPHTH-ACELL PERTUSSIS 5-2.5-18.5 LF-MCG/0.5 IM SUSY
0.5000 mL | PREFILLED_SYRINGE | Freq: Once | INTRAMUSCULAR | Status: AC
  Administered 2021-05-13: 0.5 mL via INTRAMUSCULAR
  Filled 2021-05-13: qty 0.5

## 2021-05-13 NOTE — Discharge Instructions (Signed)
Please keep laceration site clean covered and dry.  Cleanse daily with alcohol.  Take antibiotic as prescribed for 1 week.  You may use Tylenol/ibuprofen as needed for pain.  Follow-up with primary care provider, walk-in clinic or urgent care in 7 to 10 days for suture removal.  Return to the ER for any severe pain swelling or any urgent changes in her health.

## 2021-05-13 NOTE — ED Provider Notes (Signed)
[facility] EMERGENCY DEPARTMENT Provider Note   CSN: [id number] Arrival date & time: 05/13/21  1843     History  Chief Complaint  Patient presents with   Animal Bite    Carly Mclaughlin is a 26 y.o. female presents to the emergency department evaluation of dog bite to her right hand.  Patient has some superficial abrasions to the dorsum of the right hand but most significantly has a 2.5 cm laceration to the base of the right ring finger along the A1 pulley.  There is no catching triggering locking.  She has full active flexion extension of the ring finger.  Sensation is intact distally.  Tetanus status unknown.  This injury occurred by her dog's vaccinations are up-to-date.  Animal control notified.  HPI     Home Medications Prior to Admission medications   Medication Sig Start Date End Date Taking? Authorizing Provider  amoxicillin-clavulanate (AUGMENTIN) 875-125 MG tablet Take 1 tablet by mouth every 12 (twelve) hours for 7 days. 05/13/21 05/20/21 Yes Duanne Guess, PA-C  ibuprofen (ADVIL) 600 MG tablet Take 1 tablet (600 mg total) by mouth every 6 (six) hours as needed for moderate pain. 05/13/21  Yes Duanne Guess, PA-C  acetaminophen (TYLENOL) 160 MG/5ML liquid Take by mouth every 4 (four) hours as needed for fever.    [provider]  amphetamine-dextroamphetamine (ADDERALL XR) 20 MG 24 hr capsule Take 1 capsule (20 mg total) by mouth every morning. 01/25/19   Venia Carbon, MD  clindamycin-benzoyl peroxide (BENZACLIN) gel Apply 1 application topically every morning.    [provider]  clindamycin-tretinoin Pershing Proud) gel Apply 1 application topically at bedtime.    [provider]  folic acid (FOLVITE) 1 MG tablet  10/20/18   [provider]  hydrocortisone 2.5 % cream Apply topically 3 (three) times daily as needed. 01/27/20   Venia Carbon, MD  inFLIXimab (REMICADE IV) Inject into the vein. Every 4 weeks     [provider]  ketoconazole (NIZORAL) 2 % shampoo APPLY TOPICALLY TWO TIMES A WEEK AS DIRECTED 12/21/18   Viviana Simpler I, MD  methotrexate (RHEUMATREX) 2.5 MG tablet TAKE 6 TABLETS ON ONE DAY EVERY WEEK 12/10/18   [provider]  TRI-LO-ESTARYLLA 0.18/0.215/0.25 MG-25 MCG tab Take 1 tablet by mouth daily. 06/26/20   [provider]  triamcinolone ointment (KENALOG) 0.1 %  11/22/16   [provider]      Allergies    Sulfa antibiotics    Review of Systems   Review of Systems  Physical Exam Updated Vital Signs BP 135/89    Pulse (!) 113    Temp 99 F (37.2 C) (Oral)    Resp 16    SpO2 93%  Physical Exam Constitutional:      Appearance: She is well-developed.  HENT:     Head: Normocephalic and atraumatic.  Eyes:     Conjunctiva/sclera: Conjunctivae normal.  Cardiovascular:     Rate and Rhythm: Normal rate.  Pulmonary:     Effort: Pulmonary effort is normal. No respiratory distress.  Musculoskeletal:        General: Normal range of motion.     Cervical back: Normal range of motion.     Comments: Right hand shows full active flexion extension of the digits.  2 cm laceration to the base of the right ring finger just over the A1 pulley, no visible or palpable foreign bodies.  Bleeding well controlled.  Flexor tendon is intact.  Minimal superficial abrasions to the dorsum of the hand.  Hand is swollen throughout the carpal region with no significant ecchymosis.  Skin:    General: Skin is warm.     Findings: No rash.  Neurological:     Mental Status: She is alert and oriented to person, place, and time.  Psychiatric:        Behavior: Behavior normal.        Thought Content: Thought content normal.    ED Results / Procedures / Treatments   Labs (all labs ordered are listed, but only abnormal results are displayed) Labs Reviewed - No data to display  EKG None  Radiology DG Hand Complete Right  Result Date: 05/13/2021 CLINICAL DATA:  Dog  bite EXAM: RIGHT HAND - COMPLETE 3+ VIEW COMPARISON:  None. FINDINGS: Frontal, oblique, and lateral views of the right hand are obtained. No fracture or radiopaque foreign body. Joint spaces are well preserved. Soft tissues are unremarkable. IMPRESSION: 1. Unremarkable right hand. Electronically Signed   By: Randa Ngo M.D.   On: 05/13/2021 19:47    Procedures .Marland KitchenLaceration Repair  Date/Time: 05/13/2021 8:01 PM Performed by: Duanne Guess, PA-C Authorized by: Duanne Guess, PA-C   Consent:    Consent obtained:  Verbal   Consent given by:  Patient   Risks discussed:  Infection, pain and retained foreign body   Alternatives discussed:  No treatment Anesthesia:    Anesthesia method:  Topical application   Topical anesthetic:  LET Laceration details:    Location:  Hand   Hand location:  R palm   Length (cm):  2.5   Depth (mm):  3 Pre-procedure details:    Preparation:  Patient was prepped and draped in usual sterile fashion Exploration:    Imaging obtained: x-ray     Imaging outcome: foreign body not noted     Wound exploration: wound explored through full range of motion and entire depth of wound visualized     Contaminated: yes   Treatment:    Area cleansed with:  Povidone-iodine and saline   Amount of cleaning:  Standard   Irrigation method:  Pressure wash   Visualized foreign bodies/material removed: no   Skin repair:    Repair method:  Tissue adhesive and sutures   Suture size:  5-0   Suture material:  Nylon   Suture technique:  Simple interrupted   Number of sutures:  2 Approximation:    Approximation:  Close Repair type:    Repair type:  Simple Post-procedure details:    Dressing:  Adhesive bandage   Procedure completion:  Tolerated well, no immediate complications    Medications Ordered in ED Medications  Tdap (BOOSTRIX) injection 0.5 mL (0.5 mLs Intramuscular Given 05/13/21 1947)  amoxicillin-clavulanate (AUGMENTIN) 875-125 MG per tablet 1 tablet (1  tablet Oral Given 05/13/21 1946)  lidocaine-EPINEPHrine-tetracaine (LET) topical gel (3 mLs Topical Given 05/13/21 1948)  ibuprofen (ADVIL) tablet 800 mg (800 mg Oral Given 05/13/21 1946)    ED Course/ Medical Decision Making/ A&P                           Medical Decision Making Amount and/or Complexity of Data Reviewed Radiology: ordered.  Risk Prescription drug management.   26 year old female with dog bite to the right hand.  X-rays obtained and reviewed showing no fractures, foreign body.  Patient has normal sensation with no tendon deficits.  Laceration thoroughly irrigated with Betadine and saline and  repaired with two 5-0 nylon sutures.  She is placed on prophylactic antibiotics and tetanus status updated here in the ED today.  She will follow-up with PCP in 5 7 to 10 days for wound care Final Clinical Impression(s) / ED Diagnoses Final diagnoses:  Dog bite of right hand, initial encounter  Laceration of right palm, initial encounter    Rx / DC Orders ED Discharge Orders          Ordered    amoxicillin-clavulanate (AUGMENTIN) 875-125 MG tablet  Every 12 hours        05/13/21 1935    ibuprofen (ADVIL) 600 MG tablet  Every 6 hours PRN        05/13/21 1935              Renata Caprice 05/13/21 2031    Naaman Plummer, MD 05/14/21 [phone]

## 2021-05-13 NOTE — ED Triage Notes (Signed)
Pt comes with c/o dog bite to right inner palm and fingers. Pt has bleeding controlled. Pt states it was her dog who bit her bc another dog came into the yard with theirs.

## 2022-04-06 IMAGING — DX DG HAND COMPLETE 3+V*R*
3 series · 3 of 3 positions shown · non-contrast
Comparison: None.

CLINICAL DATA: Dog bite

EXAM:
RIGHT HAND - COMPLETE 3+ VIEW

[hand ap]
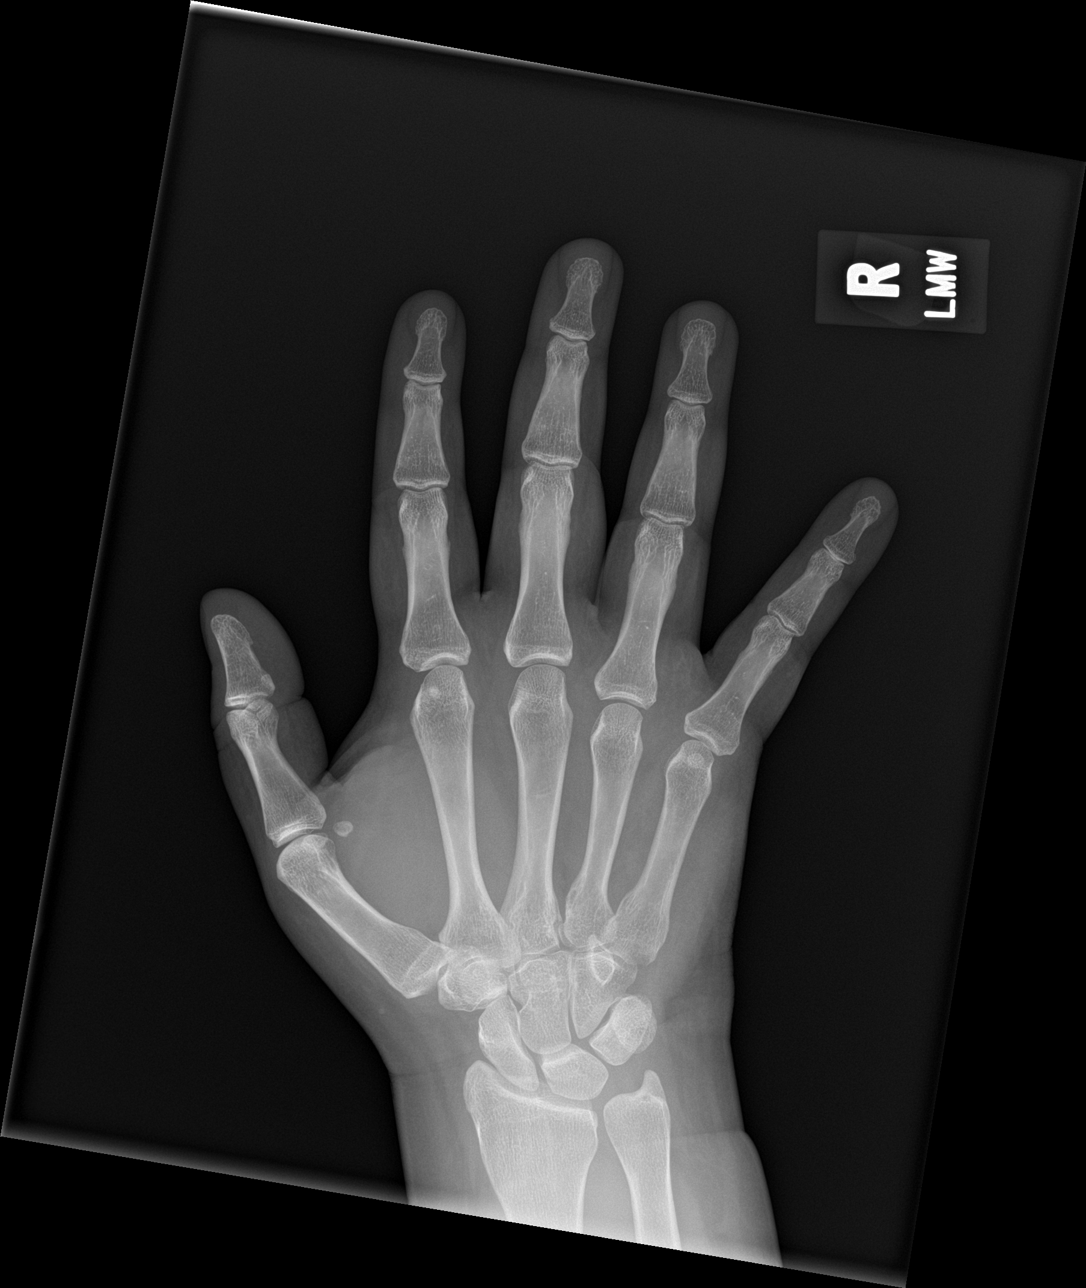

[hand obl]
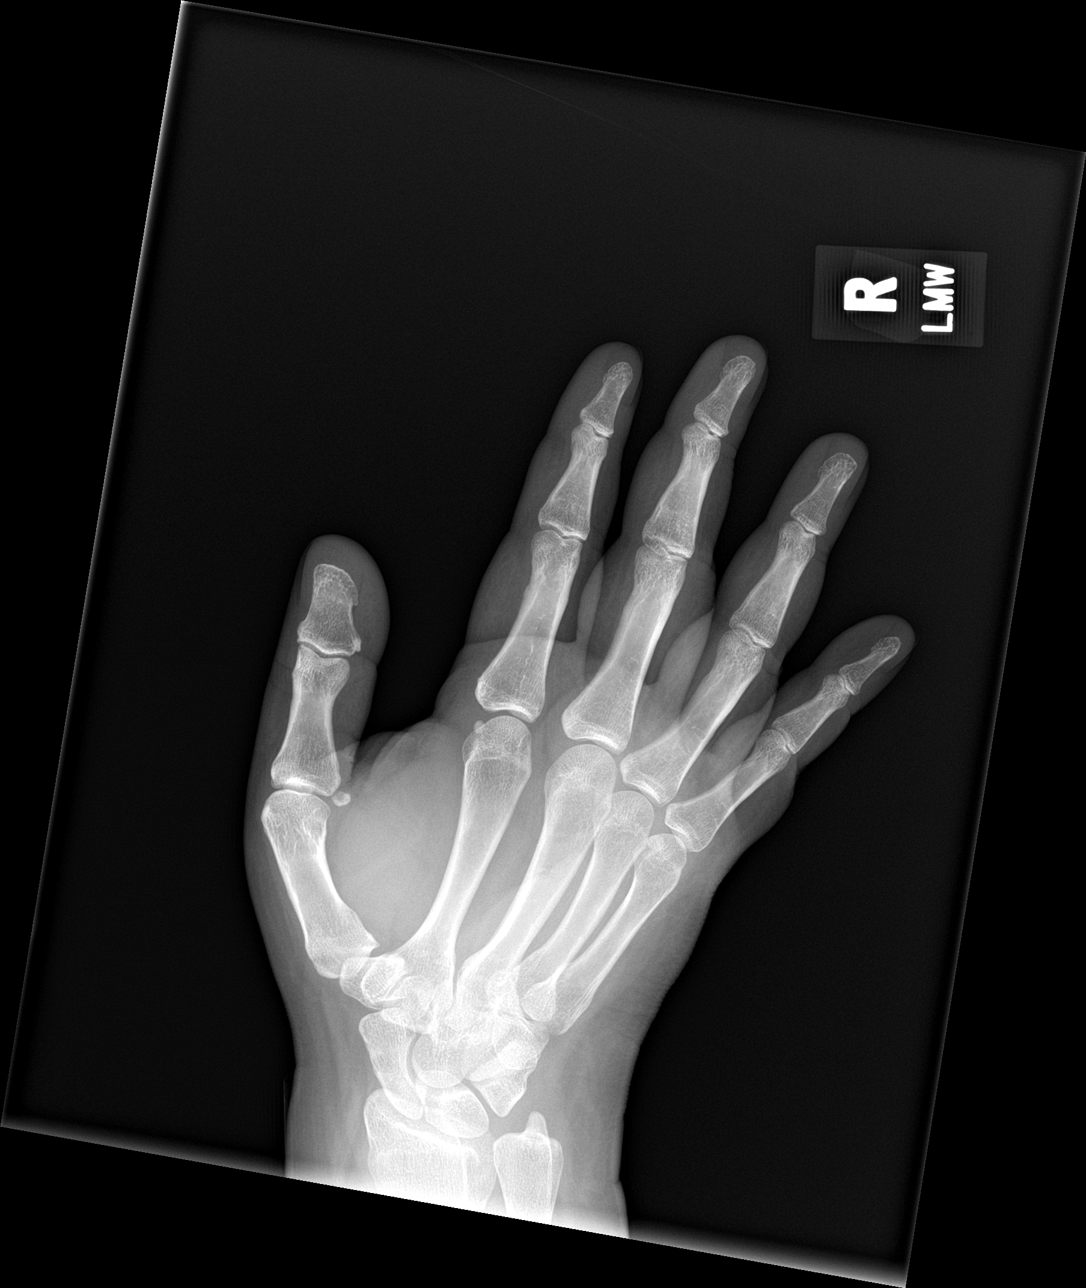

[hand lat]
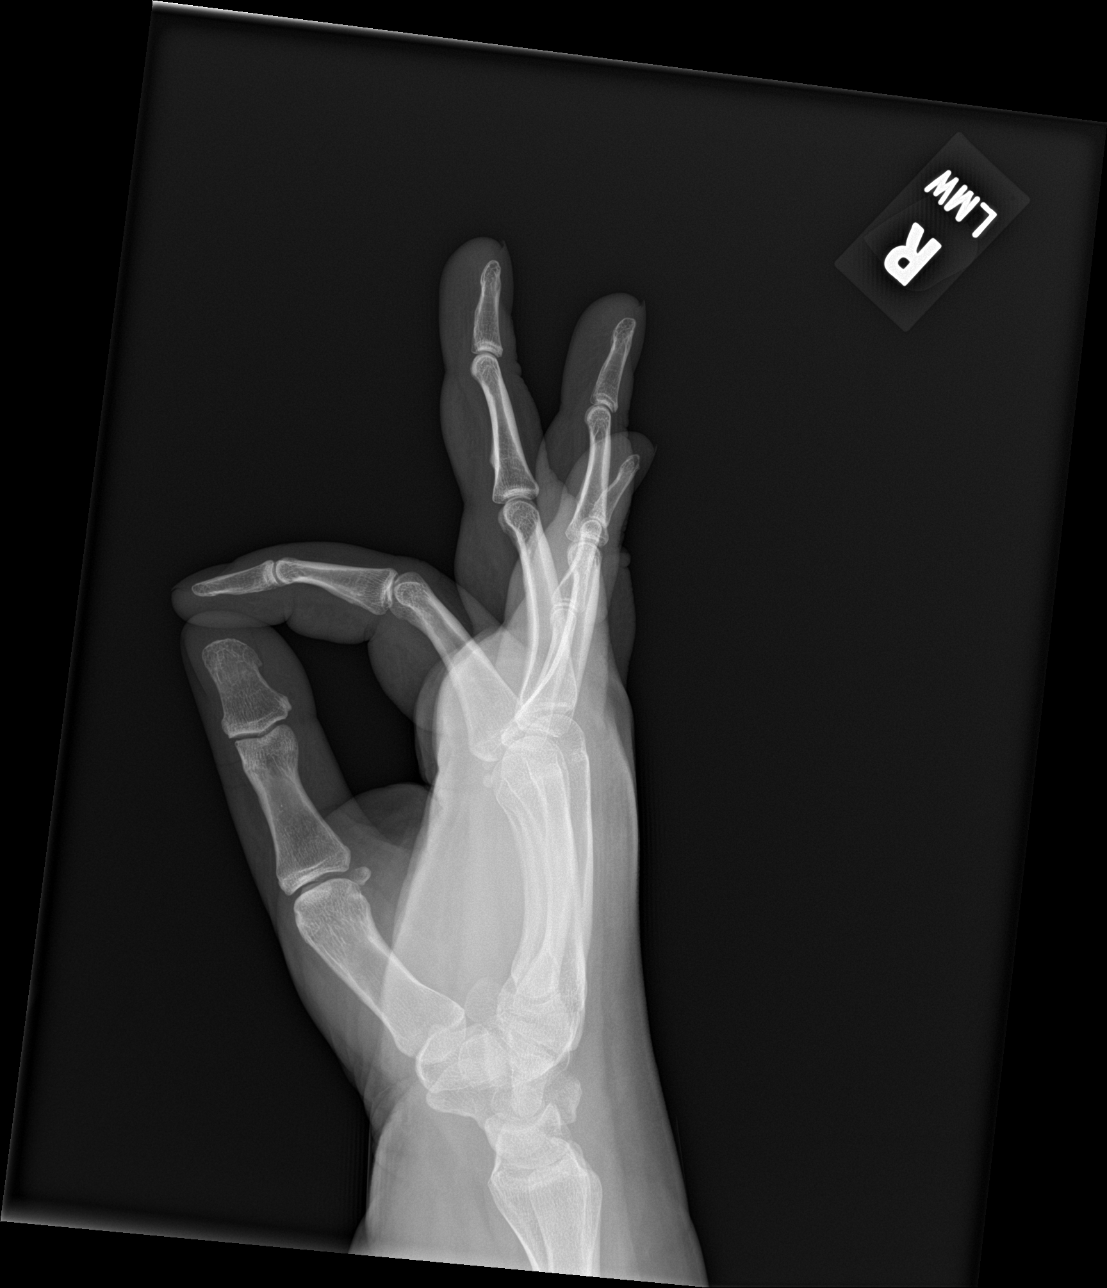

[3 of 3 positions shown; findings below may reference images not displayed]

FINDINGS: Frontal, oblique, and lateral views of the right hand are obtained.
No fracture or radiopaque foreign body. Joint spaces are well
preserved. Soft tissues are unremarkable.
IMPRESSION: 1. Unremarkable right hand.

## 2023-07-03 DIAGNOSIS — L732 Hidradenitis suppurativa: Secondary | ICD-10-CM | POA: Diagnosis not present

## 2023-07-03 DIAGNOSIS — Z79899 Other long term (current) drug therapy: Secondary | ICD-10-CM | POA: Diagnosis not present

## 2023-07-14 DIAGNOSIS — E611 Iron deficiency: Secondary | ICD-10-CM | POA: Diagnosis not present

## 2023-07-14 DIAGNOSIS — R197 Diarrhea, unspecified: Secondary | ICD-10-CM | POA: Diagnosis not present

## 2023-07-14 DIAGNOSIS — R070 Pain in throat: Secondary | ICD-10-CM | POA: Diagnosis not present

## 2023-07-14 DIAGNOSIS — L732 Hidradenitis suppurativa: Secondary | ICD-10-CM | POA: Diagnosis not present

## 2023-11-17 DIAGNOSIS — D509 Iron deficiency anemia, unspecified: Secondary | ICD-10-CM | POA: Diagnosis not present

## 2024-01-01 DIAGNOSIS — L732 Hidradenitis suppurativa: Secondary | ICD-10-CM | POA: Diagnosis not present

## 2024-01-01 DIAGNOSIS — L309 Dermatitis, unspecified: Secondary | ICD-10-CM | POA: Diagnosis not present

## 2024-01-01 DIAGNOSIS — L409 Psoriasis, unspecified: Secondary | ICD-10-CM | POA: Diagnosis not present

## 2024-01-16 DIAGNOSIS — L732 Hidradenitis suppurativa: Secondary | ICD-10-CM | POA: Diagnosis not present

## 2024-02-13 DIAGNOSIS — L732 Hidradenitis suppurativa: Secondary | ICD-10-CM | POA: Diagnosis not present

## 2024-02-15 DIAGNOSIS — K580 Irritable bowel syndrome with diarrhea: Secondary | ICD-10-CM | POA: Diagnosis not present

## 2024-02-15 DIAGNOSIS — D509 Iron deficiency anemia, unspecified: Secondary | ICD-10-CM | POA: Diagnosis not present

## 2024-02-15 DIAGNOSIS — N921 Excessive and frequent menstruation with irregular cycle: Secondary | ICD-10-CM | POA: Diagnosis not present

## 2024-03-12 DIAGNOSIS — L732 Hidradenitis suppurativa: Secondary | ICD-10-CM | POA: Diagnosis not present
